# Patient Record
Sex: Male | Born: 1985 | Race: White | Hispanic: No | Marital: Single | Smoking: Former smoker
Health system: Southern US, Community
[De-identification: ages and names within clinical notes are randomized; demographics above are authoritative.]

## PROBLEM LIST (undated history)

## (undated) ENCOUNTER — Emergency Department (HOSPITAL_COMMUNITY): Admission: EM | Payer: Self-pay | Source: Home / Self Care

## (undated) DIAGNOSIS — G35 Multiple sclerosis: Secondary | ICD-10-CM

## (undated) HISTORY — PX: SKIN BIOPSY: SHX1

---

## 2022-03-19 DIAGNOSIS — R454 Irritability and anger: Secondary | ICD-10-CM | POA: Insufficient documentation

## 2022-03-19 DIAGNOSIS — Z5900 Homelessness unspecified: Secondary | ICD-10-CM | POA: Insufficient documentation

## 2022-03-19 DIAGNOSIS — G35 Multiple sclerosis: Secondary | ICD-10-CM | POA: Insufficient documentation

## 2022-03-19 NOTE — Progress Notes (Signed)
TRIAGE: ROUTINE   03/19/22 2121  [facility] Triage Screening (Walk-ins at [facility] only)  How Did You Hear About [place]? Other (Comment) ([place] [employer])  What Is the Reason for Your Visit/Call Today? Pt states he was in a hotel in [facility] that he had not paid for; he states he was there a week before the hotel figured out he was not a paying guest and asked him to leave. [place] [employer] was called and they brought pt to the [facility], dropping him off in the parking lot when they arrived. Pt denies SI, a hx of SI any prior attempts to kill himself, any plans to kill himself, or any prior hospitalizations for mental health concerns. Pt denies HI, AVH, NSSIB, access to guns/weapons, engagement with the legal system, or SA.  How Long Has This Been Causing You Problems? <Week  Have You Recently Had Any Thoughts About Hurting Yourself? No  Are You Planning to Commit Suicide/Harm Yourself At This time? No  Have you Recently Had Thoughts About Hurting Someone Karolee Ohs? No  Are You Planning To Harm Someone At This Time? No  Are you currently experiencing any auditory, visual or other hallucinations? No  Have You Used Any Alcohol or Drugs in the Past 24 Hours? No  Do you have any current medical co-morbidities that require immediate attention? No  Clinician description of patient physical appearance/behavior: Pt is sitting in the triage room with his shirt off. He expresses confusion as to why he was brought to the [facility]. Pt has an odor coming from him/his clothes. He expresses his only needs are food. Pt walks slowly with a walker.  What Do You Feel Would Help You the Most Today? Food Assistance;Housing Assistance  If access to [facility] was not available, would you have sought care in the Emergency Department? No  Determination of Need Routine (7 days)  Options For Referral Other: Comment (Resources for shelters)

## 2022-03-19 NOTE — ED Provider Notes (Signed)
Behavioral Health Urgent Care Medical Screening Exam  Patient Name: Shane Ramirez MRN: [id number] Date of Evaluation: 03/19/22 Chief Complaint:   Diagnosis:  Final diagnoses:  Homelessness    History of Present illness: Zyeir Dymek is a 36 y.o. male. ***  Psychiatric Specialty Exam  Presentation  General Appearance:Disheveled  Eye Contact:Minimal  Speech:Clear and Coherent; Normal Rate  Speech Volume:Normal  Handedness:No data recorded  Mood and Affect  Mood:Irritable  Affect:Restricted   Thought Process  Thought Processes:Coherent  Descriptions of Associations:Intact  Orientation:Partial  Thought Content:No data recorded   Hallucinations:None  Ideas of Reference:None  Suicidal Thoughts:No  Homicidal Thoughts:No   Sensorium  Memory:Immediate Good  Judgment:Intact  Insight:Present   Executive Functions  Concentration:No data recorded Attention Span:No data recorded Recall:No data recorded Fund of Knowledge:No data recorded Language:No data recorded  Psychomotor Activity  Psychomotor Activity:Shuffling Gait   Assets  Assets:Desire for Improvement   Sleep  Sleep:Fair  Number of hours: No data recorded  Nutritional Assessment (For OBS and FBC admissions only) Has the patient had a weight loss or gain of 10 pounds or more in the last 3 months?: No Has the patient had a decrease in food intake/or appetite?: No Does the patient have dental problems?: No Does the patient have eating habits or behaviors that may be indicators of an eating disorder including binging or inducing vomiting?: No Has the patient recently lost weight without trying?: 0 Has the patient been eating poorly because of a decreased appetite?: 0 Malnutrition Screening Tool Score: 0    Physical Exam: Physical Exam ROS Blood pressure 109/61, pulse 98, temperature 98.7 F (37.1 C), temperature source Oral, resp. rate 18, SpO2 98 %. There is no height or weight on file  to calculate BMI.  Musculoskeletal: Strength & Muscle Tone: {desc; muscle tone:32375} Gait & Station: {PE GAIT ED QPYP:95093} Patient leans: {Patient Leans:21022755}   [facility] MSE Discharge Disposition for Follow up and Recommendations: {[facility] MSE Recommendations:24277}   Jackelyn Poling, NP 03/19/2022, 11:59 PM

## 2022-03-19 NOTE — Discharge Instructions (Addendum)
Discharge recommendations:  Patient is to take medications as prescribed. Please see information for follow-up appointment with psychiatry and therapy. Please follow up with your primary care provider for all medical related needs.   Therapy: We recommend that patient participate in individual therapy to address mental health concerns.  Safety:  The patient should abstain from use of illicit substances/drugs and abuse of any medications. If symptoms worsen or do not continue to improve or if the patient becomes actively suicidal or homicidal then it is recommended that the patient return to the closest hospital emergency department, the [facility], or call 911 for further evaluation and treatment. National Suicide Prevention Lifeline 1-800-SUICIDE or (217)102-1065.  About 988 988 offers 24/7 access to trained crisis counselors who can help people experiencing mental health-related distress. People can call or text 988 or chat 988lifeline.org for themselves or if they are worried about a loved one who may need crisis support.     Shelter List:   [facility]) [address] [place], [place] Phone: [phone]   [facility] has been providing emergency shelter to those in need of a permanent residence for over 35 years. The [facility] shelter plays an important role in our community.   There are many life events that can pull someone into a downward spiral towards poverty that is very difficult to get out of. Homelessness is a problem that can affect anyone of [place]. [facility] is a safe and comforting place to stay, especially if you have experienced the hardship of street life.   [facility] provides a single bed and bedding to 100 adult men and women. The shelter welcomes all who are in need of housing, no one in real need is turned away unless space is not available.   While staying  at  [facility], guests are offered more than just a bed for a night. Hot meals are provided and every guest has access to case management services. Case managers provide assistance with finding housing, employment, or other services that will help them gain stability. Continuous stay is based on availability, capacity, and progress towards goals.   To contact the front desk of [facility] please call   [phone] ext [phone] or ext. [phone].   [facility] [address] [place], [place] [address] Phone: [phone]    [facility] [address], [place] [address] Phone: [phone]   [facility]: [phone]. [address] [place], [place] [address] Phone: [phone]   [facility] [address] [place], [place] [address] Check in at 6:00PM for placement at a local shelter) Phone: [phone]     [facility]          -(Please Call) Phone: [phone]   [facility] Eligibility:  Must be drug and alcohol free for at least 14 days or more at the time of application. This program serves males.  Houses Engineer, civil (consulting), Economist who serve six-month terms.  Houses are financially self-supporting; members split house expenses, which average $90.00 to $130.00 per person per week.  Any Resident who relapses must be immediately expelled. Call:  [phone]   [facility]-will provide timely access to mental health services for children and adolescents (4-17) and adults presenting in a mental health crisis. The program is designed for those who need urgent Behavioral Health or  Substance Use treatment and are not experiencing a medical crisis that would typically require an emergency room visit.   [address] [place], [place] [address] Phone: [phone] [email or web address]   [facility] will also  offer the following outpatient services: (Monday through Friday 8am-5pm)   Partial Hospitalization Program (PHP) Substance Abuse Intensive Outpatient Program (SA-IOP) Group Therapy Medication Management Peer Living Room   We also provide (24/7):   Assessments: Our mental health clinician and providers will conduct a focused mental health evaluation, assessing for immediate safety concerns and further mental health needs.   Referral: Our team will provide resources and help connect to community based mental health treatment, when indicated, including psychotherapy, psychiatry, and other specialized behavioral health or substance use disorder services (for those not already in treatment).   Transitional Care: Our team providers in person bridging and/or telphonic follow-up during the patient's transition to outpatient services.

## 2022-03-20 ENCOUNTER — Encounter (HOSPITAL_BASED_OUTPATIENT_CLINIC_OR_DEPARTMENT_OTHER): Payer: Self-pay | Admitting: Obstetrics and Gynecology

## 2022-03-20 ENCOUNTER — Emergency Department (HOSPITAL_BASED_OUTPATIENT_CLINIC_OR_DEPARTMENT_OTHER)
Admission: EM | Admit: 2022-03-20 | Discharge: 2022-03-20 | Disposition: A | Discharge status: Home / Self Care | Payer: Self-pay | Attending: Emergency Medicine | Admitting: Emergency Medicine

## 2022-03-20 ENCOUNTER — Encounter (HOSPITAL_COMMUNITY): Payer: Self-pay

## 2022-03-20 ENCOUNTER — Ambulatory Visit (HOSPITAL_COMMUNITY)
Admission: EM | Admit: 2022-03-20 | Discharge: 2022-03-20 | Disposition: A | Discharge status: Home / Self Care | Payer: No Payment, Other | Attending: Nurse Practitioner | Admitting: Nurse Practitioner

## 2022-03-20 ENCOUNTER — Encounter (HOSPITAL_COMMUNITY): Payer: Self-pay | Admitting: Nurse Practitioner

## 2022-03-20 ENCOUNTER — Other Ambulatory Visit: Payer: Self-pay

## 2022-03-20 ENCOUNTER — Emergency Department (HOSPITAL_COMMUNITY)
Admission: EM | Admit: 2022-03-20 | Discharge: 2022-03-20 | Disposition: A | Discharge status: Home / Self Care | Payer: Self-pay | Attending: Emergency Medicine | Admitting: Emergency Medicine

## 2022-03-20 DIAGNOSIS — Y900 Blood alcohol level of less than 20 mg/100 ml: Secondary | ICD-10-CM | POA: Insufficient documentation

## 2022-03-20 DIAGNOSIS — R3989 Other symptoms and signs involving the genitourinary system: Secondary | ICD-10-CM

## 2022-03-20 DIAGNOSIS — R410 Disorientation, unspecified: Secondary | ICD-10-CM | POA: Insufficient documentation

## 2022-03-20 DIAGNOSIS — Z5900 Homelessness unspecified: Secondary | ICD-10-CM | POA: Insufficient documentation

## 2022-03-20 DIAGNOSIS — R339 Retention of urine, unspecified: Secondary | ICD-10-CM | POA: Insufficient documentation

## 2022-03-20 DIAGNOSIS — G35 Multiple sclerosis: Secondary | ICD-10-CM | POA: Insufficient documentation

## 2022-03-20 HISTORY — DX: Multiple sclerosis: G35

## 2022-03-20 LAB — COMPREHENSIVE METABOLIC PANEL
ALT: 29 U/L (ref 0–44)
AST: 18 U/L (ref 15–41)
Albumin: 3.9 g/dL (ref 3.5–5.0)
Alkaline Phosphatase: 71 U/L (ref 38–126)
Anion gap: 7 (ref 5–15)
BUN: 22 mg/dL — ABNORMAL HIGH (ref 6–20)
CO2: 28 mmol/L (ref 22–32)
Calcium: 9.6 mg/dL (ref 8.9–10.3)
Chloride: 106 mmol/L (ref 98–111)
Creatinine, Ser: 0.85 mg/dL (ref 0.61–1.24)
GFR, Estimated: >60 mL/min (ref >60)
Glucose, Bld: 105 mg/dL — ABNORMAL HIGH (ref 70–99)
Potassium: 3.7 mmol/L (ref 3.5–5.1)
Sodium: 141 mmol/L (ref 135–145)
Total Bilirubin: 0.5 mg/dL (ref 0.3–1.2)
Total Protein: 6.3 g/dL — ABNORMAL LOW (ref 6.5–8.1)

## 2022-03-20 LAB — URINALYSIS, ROUTINE W REFLEX MICROSCOPIC
Bilirubin Urine: NEGATIVE
Glucose, UA: NEGATIVE mg/dL
Hgb urine dipstick: NEGATIVE
Ketones, ur: NEGATIVE mg/dL
Leukocytes,Ua: NEGATIVE
Nitrite: NEGATIVE
Protein, ur: NEGATIVE mg/dL
Specific Gravity, Urine: 1.032 — ABNORMAL HIGH (ref 1.005–1.030)
pH: 5 (ref 5.0–8.0)

## 2022-03-20 LAB — CBC WITH DIFFERENTIAL/PLATELET
Abs Immature Granulocytes: 0.01 10*3/uL (ref 0.00–0.07)
Basophils Absolute: 0.1 10*3/uL (ref 0.0–0.1)
Basophils Relative: 1 %
Eosinophils Absolute: 0.1 10*3/uL (ref 0.0–0.5)
Eosinophils Relative: 2 %
HCT: 39.3 % (ref 39.0–52.0)
Hemoglobin: 12.9 g/dL — ABNORMAL LOW (ref 13.0–17.0)
Immature Granulocytes: 0 %
Lymphocytes Relative: 40 %
Lymphs Abs: 2.1 10*3/uL (ref 0.7–4.0)
MCH: 28.7 pg (ref 26.0–34.0)
MCHC: 32.8 g/dL (ref 30.0–36.0)
MCV: 87.3 fL (ref 80.0–100.0)
Monocytes Absolute: 0.8 10*3/uL (ref 0.1–1.0)
Monocytes Relative: 15 %
Neutro Abs: 2.2 10*3/uL (ref 1.7–7.7)
Neutrophils Relative %: 42 %
Platelets: 182 10*3/uL (ref 150–400)
RBC: 4.5 MIL/uL (ref 4.22–5.81)
RDW: 14.2 % (ref 11.5–15.5)
WBC: 5.3 10*3/uL (ref 4.0–10.5)
nRBC: 0 % (ref 0.0–0.2)

## 2022-03-20 LAB — ETHANOL: Alcohol, Ethyl (B): <10 mg/dL (ref <10)

## 2022-03-20 NOTE — ED Provider Notes (Signed)
[facility] EMERGENCY DEPT Provider Note   CSN: [id number] Arrival date & time: 03/20/22  1252     History {Add pertinent medical, surgical, social history, OB history to HPI:1} Chief Complaint  Patient presents with  . Weakness    Shane Ramirez is a 36 y.o. male.  HPI Patient reports he only has a history of MS and does not take any medications.  Patient is a difficult historian and that for most questions he responds "I do not know".  However, during the course of the interview, the patient told me that he came from [place].  He reports he did have housing in [place] but "it got away from him".  He reports he was dropped off at a bus station and came to [place] hoping to find his brother.  However, he reports his brother is not around here [place] [employer] [place].  Patient reports somebody picked him up and brought him to the emergency department but he does not know why.  When asked him what he hopes to achieve from his visit, he replied "a warm place with some grass."  Patient denies he has any specific symptoms, but as I tried to go to the review of systems he replies, "Yeah sure. That."  Patient denies any drug or alcohol use.    Home Medications Prior to Admission medications   Not on File      Allergies    Patient has no known allergies.    Review of Systems   Review of Systems Level 5 caveat cannot obtain good review of systems due to patient's level of cooperation. Physical Exam Updated Vital Signs BP 113/60 (BP Location: Right Arm)   Pulse 72   Temp 98.1 F (36.7 C)   Resp 16   SpO2 100%  Physical Exam Constitutional:      Comments: Patient is resting quietly.  No acute distress.  Color is good.  No respiratory distress.  moderate obese.  HENT:     Head: Normocephalic and atraumatic.     Nose: Nose normal.     Mouth/Throat:     Mouth: Mucous membranes are moist.     Pharynx: Oropharynx is clear.     Comments:  Airway is clear.  Mucous membranes are moist. Eyes:     Extraocular Movements: Extraocular movements intact.     Conjunctiva/sclera: Conjunctivae normal.     Pupils: Pupils are equal, round, and reactive to light.  Cardiovascular:     Rate and Rhythm: Normal rate and regular rhythm.  Pulmonary:     Effort: Pulmonary effort is normal.     Breath sounds: Normal breath sounds.  Abdominal:     General: There is no distension.     Palpations: Abdomen is soft.     Tenderness: There is no abdominal tenderness. There is no guarding.  Musculoskeletal:     Comments: No peripheral edema.  Musculature of the lower legs is good.  Normal hair coverage and skin condition.  Skin:    General: Skin is warm and dry.  Neurological:     Comments: Patient is alert.  He does not show signs of confusion.  He is oriented to where he is and his past medical history.  He can follow commands but is mildly hostile.  No appreciable focal motor deficits at rest but at baseline patient does use a walker for MS.  Patient does not appear to have significant muscular atrophy to general exam.  ED Results / Procedures / Treatments   Labs (all labs ordered are listed, but only abnormal results are displayed) Labs Reviewed  COMPREHENSIVE METABOLIC PANEL - Abnormal; Notable for the following components:      Result Value   Glucose, Bld 105 (*)    BUN 22 (*)    Total Protein 6.3 (*)    All other components within normal limits  CBC WITH DIFFERENTIAL/PLATELET - Abnormal; Notable for the following components:   Hemoglobin 12.9 (*)    All other components within normal limits  ETHANOL  RAPID URINE DRUG SCREEN, HOSP PERFORMED    EKG None  Radiology No results found.  Procedures Procedures  {Document cardiac monitor, telemetry assessment procedure when appropriate:1}  Medications Ordered in ED Medications - No data to display  ED Course/ Medical Decision Making/ A&P                           Medical  Decision Making Amount and/or Complexity of Data Reviewed Labs: ordered.   Patient brought by EMS.  {Document critical care time when appropriate:1} {Document review of labs and clinical decision tools ie heart score, Chads2Vasc2 etc:1}  {Document your independent review of radiology images, and any outside records:1} {Document your discussion with family members, caretakers, and with consultants:1} {Document social determinants of health affecting pt's care:1} {Document your decision making why or why not admission, treatments were needed:1} Final Clinical Impression(s) / ED Diagnoses Final diagnoses:  Homeless  Multiple sclerosis ([place])    Rx / DC Orders ED Discharge Orders     None

## 2022-03-20 NOTE — Discharge Instructions (Addendum)
1.  A resource guide has been included in your discharge instructions.  This includes information for social services in the [place] area and homeless shelters. 2.  As well as these resources you should try to get established with a family doctor.  There are low-cost clinics in the area.  You should try to establish follow-up as soon as possible.

## 2022-03-20 NOTE — ED Triage Notes (Signed)
Patient reports to the ER BIB EMS:  Patient has a hx of MS. Bystander called EMS. Denies allergies or other medications.  Denies LOC but reports weakness caused him to lay down.   Patient reports he is homeless.  CBG 106

## 2022-03-20 NOTE — ED Notes (Signed)
Pt informed me that he wasn't complaining of anything. Everything that was an issue was chronic from his MS and nothing different/changed.

## 2022-03-20 NOTE — ED Notes (Signed)
Bladder scan 0 ml post void.

## 2022-03-20 NOTE — Progress Notes (Signed)
CSW added resources to AVS for resources.   Maryjean Ka, MSW, LCSWA 03/20/2022 12:24 AM

## 2022-03-20 NOTE — ED Provider Notes (Signed)
[facility] EMERGENCY DEPARTMENT Provider Note   CSN: [id number] Arrival date & time: 03/20/22  2197     History  Chief Complaint  Patient presents with   Urinary Retention    Shane Ramirez is a 36 y.o. male with homelessness who presents to the emergency department stating that he requires running water in order to be able to urinate and as he is homeless he does not have any.  States he has not voided since this morning because of this problem.  Of note patient is ambulatory with walker at baseline reportedly from Multiple sclerosis for which she is not treated.  States he has relocated from [facility].  Was seen at [facility] this morning after removal by police department from hotel after failure to pay for multiple days.  He states he does not have any urinary frequency/urgency/dysuria/difficulty peeing.  States he just needed to listen to running water in order to void.  HPI     Home Medications Prior to Admission medications   Not on File      Allergies    Patient has no known allergies.    Review of Systems   Review of Systems  Genitourinary:        ? Urinary retention   Physical Exam Updated Vital Signs BP 119/86   Pulse 95   Temp 98.3 F (36.8 C) (Oral)   Resp 18   SpO2 97%  Physical Exam Vitals and nursing note reviewed.  Constitutional:      Appearance: He is not ill-appearing or toxic-appearing.  HENT:     Head: Normocephalic and atraumatic.     Mouth/Throat:     Mouth: Mucous membranes are moist.     Pharynx: No oropharyngeal exudate or posterior oropharyngeal erythema.  Eyes:     General:        Right eye: No discharge.        Left eye: No discharge.     Extraocular Movements: Extraocular movements intact.     Conjunctiva/sclera: Conjunctivae normal.     Pupils: Pupils are equal, round, and reactive to light.  Cardiovascular:     Rate and Rhythm: Normal rate and regular rhythm.     Pulses: Normal pulses.     Heart sounds:  Normal heart sounds. No murmur heard. Pulmonary:     Effort: Pulmonary effort is normal. No respiratory distress.     Breath sounds: Normal breath sounds. No wheezing or rales.  Abdominal:     General: Bowel sounds are normal. There is no distension.     Palpations: Abdomen is soft.     Tenderness: There is no abdominal tenderness. There is no right CVA tenderness, guarding or rebound.  Musculoskeletal:        General: No deformity.     Cervical back: Neck supple.     Right lower leg: No edema.  Skin:    General: Skin is warm and dry.     Capillary Refill: Capillary refill takes less than 2 seconds.  Neurological:     General: No focal deficit present.     Mental Status: He is alert and oriented to person, place, and time. Mental status is at baseline.     Comments: Shuffling gait, ambulatory with walker at baseline.   Psychiatric:        Mood and Affect: Mood normal.    ED Results / Procedures / Treatments   Labs (all labs ordered are listed, but only abnormal results are displayed) Labs  Reviewed  URINALYSIS, ROUTINE W REFLEX MICROSCOPIC    EKG None  Radiology No results found.  Procedures Procedures    Medications Ordered in ED Medications - No data to display  ED Course/ Medical Decision Making/ A&P                           Medical Decision Making 36 year old male presents with report for inability to urinate since this morning.  Vital signs are normal and intake.  Cardiopulmonary abdominal exams are benign.  Patient able to provide urine sample without difficulty in triage.  Denies any symptoms at this time.  Again reiterates that he is not having any difficulty urinating, but simply requires audible running water in order to urinate.   Amount and/or Complexity of Data Reviewed Labs: ordered.    Details: UA unremarkable. no infectious findings.   No further work-up warranted near this time.  Clinical concern for emergent underlying etiology that would  warrant further ED work-up or inpatient management exceedingly low.  Patient denies any saddle anesthesia, urinary retention when he is able to listen to running water, urinary or fecal incontinence.  Clinically not concerned for cauda equina syndrome. Post-void residual with bladder scanner < 10 cc. He is declining any further testing this evening.  Continues to endorse that he is at his neurologic baseline with ambulation though he is not treated for his multiple sclerosis.  No further work-up warranted near this time.  Shane Ramirez voiced understanding of her medical evaluation and treatment plan. Each of their questions answered to their expressed satisfaction.  Return precautions were given.  Patient is well-appearing, stable, and was discharged in good condition.  This chart was dictated using voice recognition software, Dragon. Despite the best efforts of this provider to proofread and correct errors, errors may still occur which can change documentation meaning.   Final Clinical Impression(s) / ED Diagnoses Final diagnoses:  None    Rx / DC Orders ED Discharge Orders     None         Paris Lore, PA-C 03/20/22 0443    Tilden Fossa, MD 03/20/22 [phone]

## 2022-03-20 NOTE — ED Notes (Signed)
Pt informed of need for urine. Pt stated that he didn't need to go yet.

## 2022-03-20 NOTE — ED Triage Notes (Signed)
Pt states that he has not voided since this AM

## 2022-03-20 NOTE — ED Notes (Signed)
Discharge paperwork given and understood. Subject was asked where and how he was getting there. Subject was unsure and kept saying he didn't now. Help was offered (cab/bus) but when asked where he wanted to go/where did he come from. He kept saying he didn't know. Subject stated that he would just walk outside and he didn't need any help getting there.

## 2022-03-20 NOTE — Discharge Instructions (Signed)
You are seen in the ER today for your reported inability pee.  He urinated fine in the emergency department.  There is no infection in your urine.  Follow-up in the outpatient setting with the resources provided to you earlier today and with a free clinic listed below and return to the ER with any severe symptoms

## 2022-03-21 ENCOUNTER — Encounter (HOSPITAL_COMMUNITY): Payer: Self-pay

## 2022-03-21 ENCOUNTER — Other Ambulatory Visit: Payer: Self-pay

## 2022-03-21 ENCOUNTER — Emergency Department (HOSPITAL_COMMUNITY)
Admission: EM | Admit: 2022-03-21 | Discharge: 2022-03-21 | Disposition: A | Discharge status: Home / Self Care | Payer: Self-pay | Attending: Emergency Medicine | Admitting: Emergency Medicine

## 2022-03-21 DIAGNOSIS — R52 Pain, unspecified: Secondary | ICD-10-CM | POA: Insufficient documentation

## 2022-03-21 DIAGNOSIS — Z5902 Unsheltered homelessness: Secondary | ICD-10-CM | POA: Insufficient documentation

## 2022-03-21 NOTE — ED Triage Notes (Signed)
Pt reports pain all over and hungry.

## 2022-03-21 NOTE — ED Provider Notes (Signed)
[facility] EMERGENCY DEPARTMENT Provider Note   CSN: [id number] Arrival date & time: 03/21/22  1842     History  No chief complaint on file.   Shane Ramirez is a 36 y.o. male.  HPI  Patient with reported history of MS presents today due to "pain all over".  He arrives via EMS, EMS states patient is requesting food and suffers from unstable housing.  Patient states he has been having lower extremity pain bilaterally for about a year, no worsening pain.  No urinary symptoms, no previous surgeries to the back.  He is ambulatory with assistance of walker.  Originally from [place].  Home Medications Prior to Admission medications   Not on File      Allergies    Patient has no known allergies.    Review of Systems   Review of Systems  Physical Exam Updated Vital Signs BP 122/81 (BP Location: Right Arm)   Pulse 81   Temp 98 F (36.7 C) (Oral)   Resp 16   SpO2 100%  Physical Exam Vitals and nursing note reviewed. Exam conducted with a chaperone present.  Constitutional:      Appearance: Normal appearance.  HENT:     Head: Normocephalic and atraumatic.  Eyes:     General: No scleral icterus.       Right eye: No discharge.        Left eye: No discharge.     Extraocular Movements: Extraocular movements intact.     Pupils: Pupils are equal, round, and reactive to light.  Cardiovascular:     Rate and Rhythm: Normal rate and regular rhythm.     Pulses: Normal pulses.     Heart sounds: Normal heart sounds. No murmur heard.   No friction rub. No gallop.  Pulmonary:     Effort: Pulmonary effort is normal. No respiratory distress.     Breath sounds: Normal breath sounds.  Abdominal:     General: Abdomen is flat. Bowel sounds are normal. There is no distension.     Palpations: Abdomen is soft.     Tenderness: There is no abdominal tenderness.  Skin:    General: Skin is warm and dry.     Coloration: Skin is not jaundiced.  Neurological:     Mental  Status: He is alert. Mental status is at baseline.     Coordination: Coordination normal.     Comments: Cranial nerves II through XII are grossly intact.  Sensation light touch grossly intact, grip strength equal bilaterally and lower extremity strength equal bilaterally.    ED Results / Procedures / Treatments   Labs (all labs ordered are listed, but only abnormal results are displayed) Labs Reviewed - No data to display  EKG None  Radiology No results found.  Procedures Procedures    Medications Ordered in ED Medications - No data to display  ED Course/ Medical Decision Making/ A&P                           Medical Decision Making  Patient presents due to pain all over.  He was seen here yesterday in the ED and given resources, I reviewed laboratory work-up from that time.  CBC was without leukocytosis or anemia although he had a hemoglobin of 12.9.  No AKI, no gross electrolyte derangement.  UA unremarkable, ethanol negative.  I considered imaging but he is neurologically intact and at his baseline, do not think additional imaging  is needed at this time.  BP 122/81 (BP Location: Right Arm)   Pulse 81   Temp 98 F (36.7 C) (Oral)   Resp 16   SpO2 100%   Patient provided resources for homelessness.  Also provided information for neurology follow-up in the area and primary care follow-up in the area.  Patient was given a sandwich, he reports feeling improved.  Patient discharged in stable condition.  Case discussed with my attending who agrees with this plan.        Final Clinical Impression(s) / ED Diagnoses Final diagnoses:  Unsheltered homelessness    Rx / DC Orders ED Discharge Orders     None         Sherrill Raring, Hershal Coria 03/21/22 1906    Noemi Chapel, MD 03/21/22 2206

## 2022-03-21 NOTE — Discharge Instructions (Addendum)
Take Tylenol Motrin for pain.  Please follow-up with neurology, information provided.

## 2022-03-23 ENCOUNTER — Other Ambulatory Visit: Payer: Self-pay

## 2022-03-23 ENCOUNTER — Emergency Department (HOSPITAL_COMMUNITY)
Admission: EM | Admit: 2022-03-23 | Discharge: 2022-03-23 | Disposition: A | Discharge status: Home / Self Care | Payer: Self-pay | Attending: Emergency Medicine | Admitting: Emergency Medicine

## 2022-03-23 ENCOUNTER — Encounter (HOSPITAL_COMMUNITY): Payer: Self-pay

## 2022-03-23 DIAGNOSIS — W19XXXA Unspecified fall, initial encounter: Secondary | ICD-10-CM | POA: Insufficient documentation

## 2022-03-23 DIAGNOSIS — D649 Anemia, unspecified: Secondary | ICD-10-CM | POA: Insufficient documentation

## 2022-03-23 DIAGNOSIS — R5383 Other fatigue: Secondary | ICD-10-CM | POA: Insufficient documentation

## 2022-03-23 DIAGNOSIS — R200 Anesthesia of skin: Secondary | ICD-10-CM | POA: Insufficient documentation

## 2022-03-23 DIAGNOSIS — M791 Myalgia, unspecified site: Secondary | ICD-10-CM | POA: Insufficient documentation

## 2022-03-23 NOTE — ED Provider Notes (Signed)
[facility]-EMERGENCY DEPT Provider Note   CSN: [id number] Arrival date & time: 03/23/22  2017     History  Chief Complaint  Patient presents with   Fall    Fall Pertinent negatives include no chest pain, no abdominal pain, no headaches and no shortness of breath.   Shane Ramirez is a 36 y.o. male with past medical history of multiple sclerosis who is un-homed, who presents for lower extremity numbness.  Patient has presented to the ED twice in the last week requesting food, reports he suffers from unstable housing.  He reports earlier today he had acute onset of numbness below the waist.  He also endorses fatigue. Reports he fell earlier due to legs feeling like they are dead weights. He is not able to characterize his numbness further. He denies HA, fever, vision changes, difficulties with speech or swallowing, weakness of the lower extremities, pain radiating down the leg, urinary or bowel incontinence, shortness of breath, chest pain, nausea/vomiting.  He ambulated around the ED with a walker.  He reports history of MS, currently not on medication, does not follow with neurology.  He reports he has previously presented to the hospital several times for MS-like symptoms though is usually discharged without treatment.  He reports he has not eaten anything all day and requests to be fed.    Home Medications Prior to Admission medications   Not on File      Allergies    Patient has no known allergies.    Review of Systems   Review of Systems  Constitutional:  Positive for fatigue. Negative for fever.  Eyes:  Negative for visual disturbance.  Respiratory:  Negative for shortness of breath.   Cardiovascular:  Negative for chest pain.  Gastrointestinal:  Negative for abdominal pain and vomiting.  Genitourinary:        Negative for urinary or bowel incontinence  Musculoskeletal:  Positive for back pain.  Neurological:  Positive for numbness. Negative for weakness  and headaches.    Physical Exam Updated Vital Signs BP 127/69   Pulse 95   Temp 99.1 F (37.3 C) (Oral)   Resp 16   Ht 6\' 4"  (1.93 m)   Wt 108.9 kg   SpO2 98%   BMI 29.21 kg/m  Physical Exam Constitutional:      Appearance: He is overweight.  HENT:     Head: Normocephalic and atraumatic.     Mouth/Throat:     Mouth: Mucous membranes are moist.  Eyes:     Extraocular Movements: Extraocular movements intact.     Conjunctiva/sclera: Conjunctivae normal.     Pupils: Pupils are equal, round, and reactive to light.  Cardiovascular:     Rate and Rhythm: Normal rate and regular rhythm.     Heart sounds: Normal heart sounds.  Pulmonary:     Effort: Pulmonary effort is normal.     Breath sounds: Normal breath sounds.  Abdominal:     General: Abdomen is flat. There is no distension.     Palpations: Abdomen is soft.     Tenderness: There is no abdominal tenderness.  Musculoskeletal:     Right lower leg: No edema.     Left lower leg: No edema.  Skin:    General: Skin is warm and dry.  Neurological:     Mental Status: He is alert.     Comments: Mental Status: Patient is awake, alert, oriented x3 Speech and language: normal, fluent  Cranial Nerves: II: Pupils equal,  round, and reactive to light.  III,IV, VI: EOMI all cardinal directions, without ptosis or diploplia.  V: Facial sensation is symmetric  VII: Face appears symmetric.  VIII: Hearing is intact to voice X: Uvula and palate elevate symmetrically XI: Shoulder shrug is symmetric. XII: Tongue is midline without atrophy or fasciculations.  Motor: 5/5 BUE and 5/5 BLE Sensory: Sensation intact to light touch in bilateral UEs & LEs Deep Tendon Reflexes: 2-3 beats of clonus bilateral lower extremities Gait: deferred   Psychiatric:        Mood and Affect: Mood normal.        Behavior: Behavior normal.        Thought Content: Thought content normal.        Judgment: Judgment normal.     ED Results / Procedures /  Treatments   Labs (all labs ordered are listed, but only abnormal results are displayed) Labs Reviewed - No data to display  EKG None  Radiology No results found.  Medications Ordered in ED None  ED Course/ Medical Decision Making/ A&P                           Medical Decision Making  Cj Edgell is a 36 y.o. male with past medical history of multiple sclerosis who is un-homed, who presented for lower extremity numbness. He arrived afebrile and HDS. He had recent lab work performed at his ED visit two days ago when he presented for diffuse body pain, lab work with mild normocytic anemia otherwise unremarkable, CMP unremarkable, UA unremarkable, ethanol negative. Today he presents for fall due to his legs feeling numb from the waist down.  He believes this may be secondary to his past history of MS.  Patient has normal neurologic exam, sensation to light touch intact bilateral lower extremities.  When asked to further clarify symptoms of numbness, patient reports his legs feel dead and he is fatigued. Neurologic exam inconsistent with reported numbness. Low concern for acute myelopathy, or acute cord compression given no red flag symptoms. Patient has not eaten all day and I suspect his fatigue and LE symptoms are secondary to poor p.o. intake vs malingering.  Given his current housing and social situation, will consult case manager to assist patient in finding housing placement as well as establishing care with primary care physician.  Patient does have a few beats of clonus in bilateral lower extremities, therefore I do feel he was likely diagnosed with MS versus other CNS pathology and should establish with a neurologist.  Discussed establishing with PCP in order to obtain referral to neurology for MS monitoring and treatment, and evaluation of any new neurologic symptoms. Unfortunately I do anticipate patient will continue to present to the ED for acute complaints in the setting of unstable  housing and food insecurity. He did receive meal in the ED. He is medically stable for discharge.    Final Clinical Impression(s) / ED Diagnoses Final diagnoses:  Lower extremity numbness    Rx / DC Orders ED Discharge Orders     None         Ellison Carwin, MD 03/23/22 2334    Charlynne Pander, MD 03/26/22 1553

## 2022-03-23 NOTE — ED Triage Notes (Signed)
BIB [facility] after falling while walking outside today. States he can't feel his legs, which is normal for him. Hx of MS. No LOC, no thinners, no injury.   Per EMS, pt state he's here because he's tired, hungry, and can't feel his legs.

## 2022-03-23 NOTE — Discharge Instructions (Addendum)
You were evaluated at [facility] ED for numbness below the waist and fatigue. Based off our evaluation you are likely not having an acute multiple sclerosis flare and are safe to discharge.  One of our case managers will reach out to you. She can help you get established with a primary care physician who can help get you in contact with Neurology for further management of your MS.   Thank you for allowing [place] to be part of your care.

## 2022-03-23 NOTE — ED Notes (Signed)
Ambulatory to bathroom with steady gate and walker.

## 2022-03-24 ENCOUNTER — Encounter (HOSPITAL_COMMUNITY): Payer: Self-pay | Admitting: Internal Medicine

## 2022-03-24 ENCOUNTER — Emergency Department (HOSPITAL_COMMUNITY): Payer: Self-pay

## 2022-03-24 ENCOUNTER — Inpatient Hospital Stay (HOSPITAL_COMMUNITY)
Admission: EM | Admit: 2022-03-24 | Discharge: 2022-03-29 | DRG: 060 | Discharge status: Against Medical Advice | Payer: Self-pay | Attending: Internal Medicine | Admitting: Internal Medicine

## 2022-03-24 DIAGNOSIS — Z5900 Homelessness unspecified: Secondary | ICD-10-CM

## 2022-03-24 DIAGNOSIS — G35D Multiple sclerosis, unspecified: Secondary | ICD-10-CM | POA: Diagnosis present

## 2022-03-24 DIAGNOSIS — Z5329 Procedure and treatment not carried out because of patient's decision for other reasons: Secondary | ICD-10-CM | POA: Diagnosis not present

## 2022-03-24 DIAGNOSIS — G35 Multiple sclerosis: Principal | ICD-10-CM | POA: Diagnosis present

## 2022-03-24 DIAGNOSIS — W19XXXA Unspecified fall, initial encounter: Principal | ICD-10-CM

## 2022-03-24 DIAGNOSIS — R0781 Pleurodynia: Secondary | ICD-10-CM

## 2022-03-24 DIAGNOSIS — R296 Repeated falls: Secondary | ICD-10-CM

## 2022-03-24 DIAGNOSIS — K529 Noninfective gastroenteritis and colitis, unspecified: Secondary | ICD-10-CM | POA: Diagnosis present

## 2022-03-24 DIAGNOSIS — R197 Diarrhea, unspecified: Secondary | ICD-10-CM | POA: Diagnosis present

## 2022-03-24 LAB — CBC WITH DIFFERENTIAL/PLATELET
Abs Immature Granulocytes: 0.02 10*3/uL (ref 0.00–0.07)
Basophils Absolute: 0 10*3/uL (ref 0.0–0.1)
Basophils Relative: 1 %
Eosinophils Absolute: 0.1 10*3/uL (ref 0.0–0.5)
Eosinophils Relative: 1 %
HCT: 41.8 % (ref 39.0–52.0)
Hemoglobin: 14 g/dL (ref 13.0–17.0)
Immature Granulocytes: 0 %
Lymphocytes Relative: 22 %
Lymphs Abs: 1.6 10*3/uL (ref 0.7–4.0)
MCH: 29.6 pg (ref 26.0–34.0)
MCHC: 33.5 g/dL (ref 30.0–36.0)
MCV: 88.4 fL (ref 80.0–100.0)
Monocytes Absolute: 1.1 10*3/uL — ABNORMAL HIGH (ref 0.1–1.0)
Monocytes Relative: 15 %
Neutro Abs: 4.4 10*3/uL (ref 1.7–7.7)
Neutrophils Relative %: 61 %
Platelets: 174 10*3/uL (ref 150–400)
RBC: 4.73 MIL/uL (ref 4.22–5.81)
RDW: 13.8 % (ref 11.5–15.5)
WBC: 7.2 10*3/uL (ref 4.0–10.5)
nRBC: 0 % (ref 0.0–0.2)

## 2022-03-24 LAB — COMPREHENSIVE METABOLIC PANEL
ALT: 26 U/L (ref 0–44)
AST: 22 U/L (ref 15–41)
Albumin: 3.8 g/dL (ref 3.5–5.0)
Alkaline Phosphatase: 68 U/L (ref 38–126)
Anion gap: 4 — ABNORMAL LOW (ref 5–15)
BUN: 8 mg/dL (ref 6–20)
CO2: 27 mmol/L (ref 22–32)
Calcium: 9.2 mg/dL (ref 8.9–10.3)
Chloride: 112 mmol/L — ABNORMAL HIGH (ref 98–111)
Creatinine, Ser: 0.76 mg/dL (ref 0.61–1.24)
GFR, Estimated: >60 mL/min (ref >60)
Glucose, Bld: 97 mg/dL (ref 70–99)
Potassium: 3.9 mmol/L (ref 3.5–5.1)
Sodium: 143 mmol/L (ref 135–145)
Total Bilirubin: 1.1 mg/dL (ref 0.3–1.2)
Total Protein: 6.6 g/dL (ref 6.5–8.1)

## 2022-03-24 LAB — RAPID URINE DRUG SCREEN, HOSP PERFORMED
Amphetamines: NOT DETECTED
Barbiturates: NOT DETECTED
Benzodiazepines: NOT DETECTED
Cocaine: NOT DETECTED
Opiates: NOT DETECTED
Tetrahydrocannabinol: NOT DETECTED

## 2022-03-24 LAB — LIPASE, BLOOD: Lipase: 30 U/L (ref 11–51)

## 2022-03-24 IMAGING — CR DG RIBS W/ CHEST 3+V*L*
5 series · 5 of 5 positions shown · non-contrast
Comparison: None Available.

CLINICAL DATA: Fall.  Left chest wall pain.

EXAM:
LEFT RIBS AND CHEST - 3+ VIEW

[x chest ap]
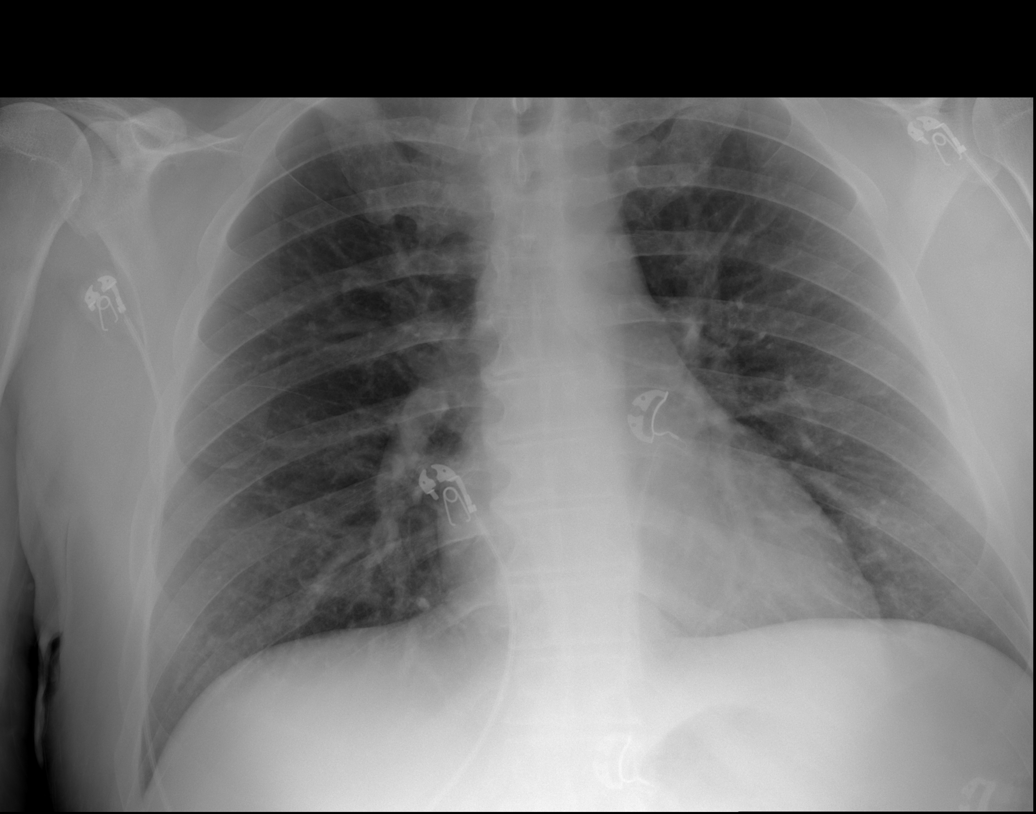

[t ribs ap upper left (1 of 2)]
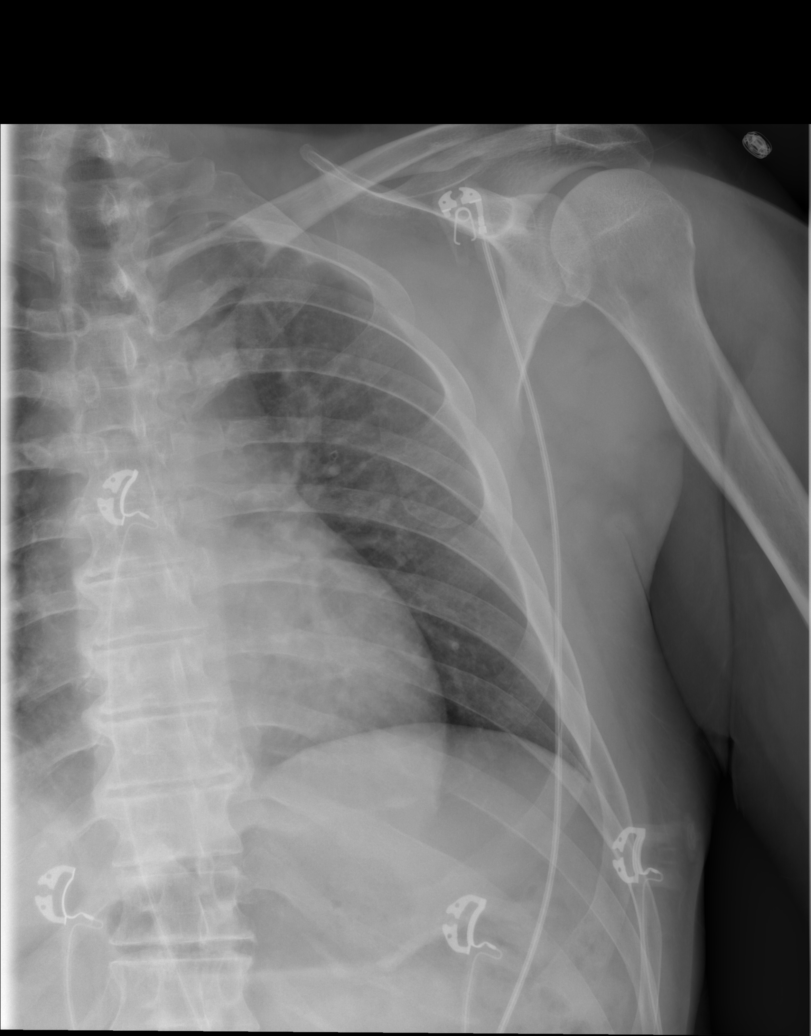

[t ribs ap upper left (2 of 2)]
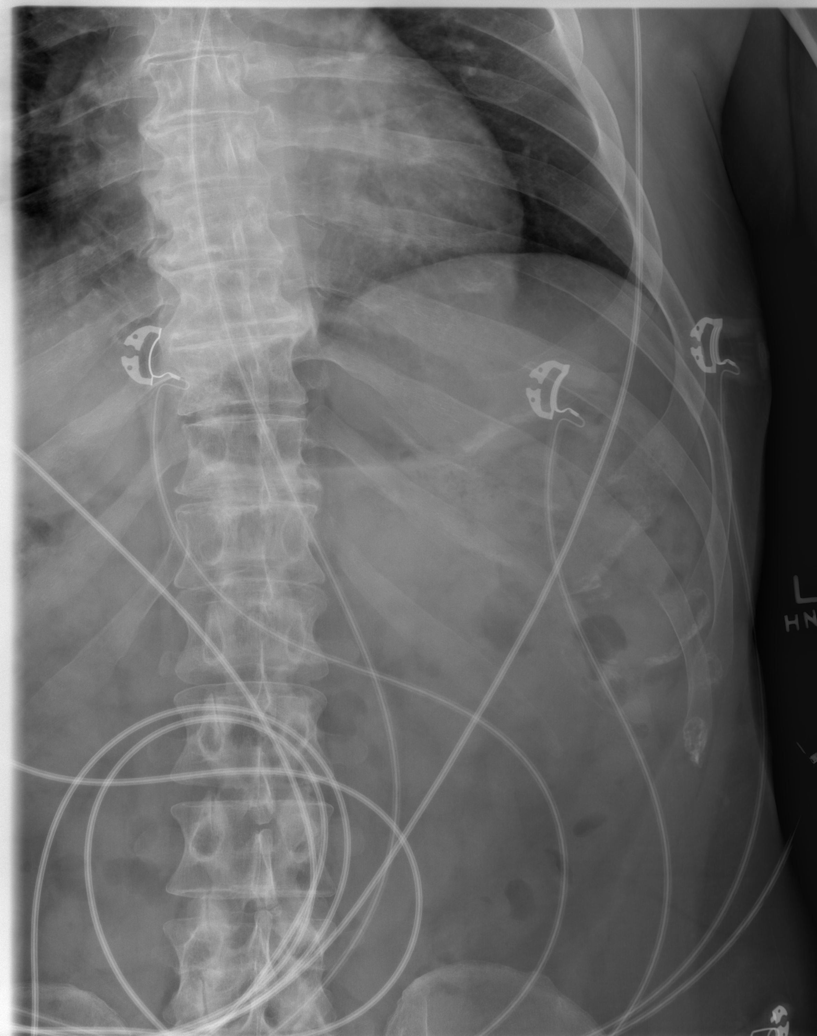

[t ribs lpo left (1 of 2)]
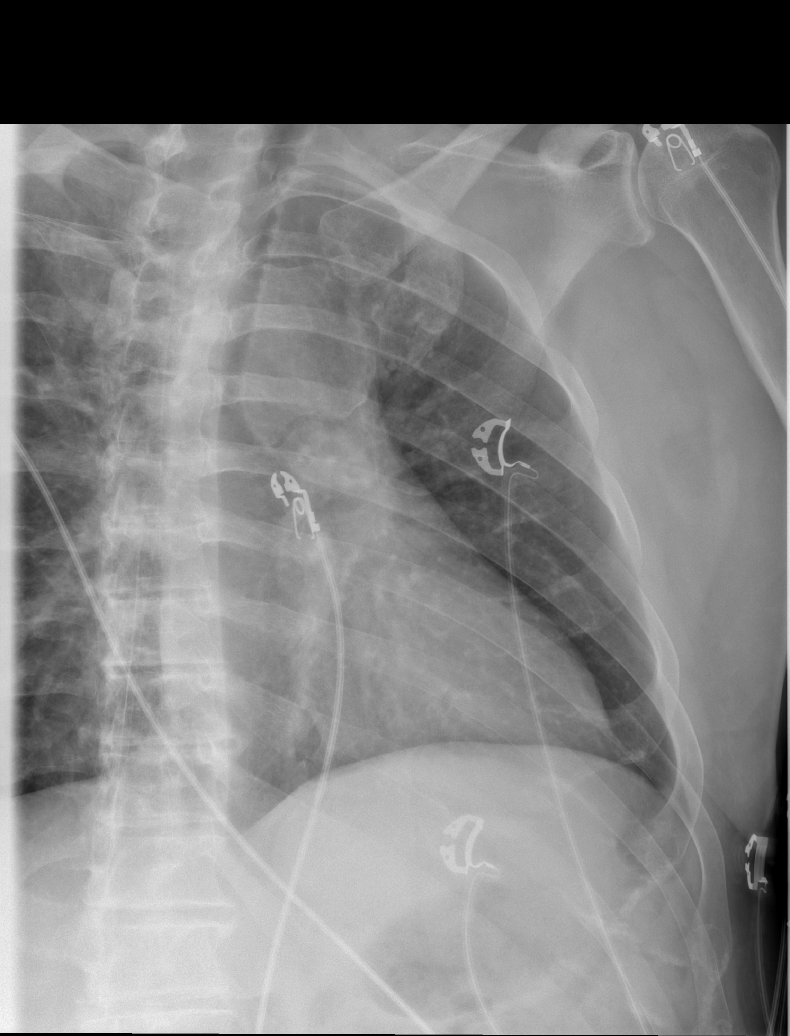

[t ribs lpo left (2 of 2)]
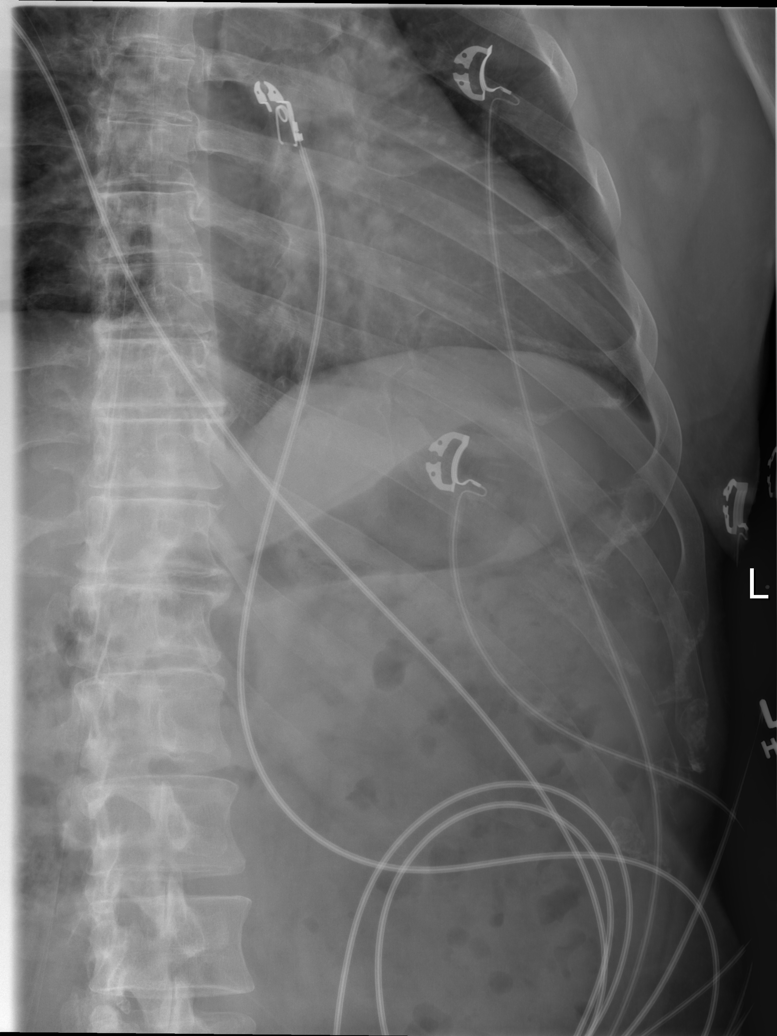

[5 of 5 positions shown; findings below may reference images not displayed]

FINDINGS: The lungs are clear without focal pneumonia, edema, pneumothorax or
pleural effusion. The cardiopericardial silhouette is within normal
limits for size. Oblique views of the left ribs show no evidence for
an acute displaced left-sided rib fracture
IMPRESSION: Negative.

## 2022-03-24 MED ORDER — ENOXAPARIN SODIUM 40 MG/0.4ML IJ SOSY
40.0000 mg | PREFILLED_SYRINGE | INTRAMUSCULAR | Status: DC
  Administered 2022-03-24 – 2022-03-27 (×4): 40 mg via SUBCUTANEOUS
  Filled 2022-03-24 (×5): qty 0.4

## 2022-03-24 NOTE — Progress Notes (Signed)
.  Transition of Care [facility]) - Emergency Department Mini Assessment   Patient Details  Name: Shane Ramirez MRN: [id number] Date of Birth: November 03, 1985  Transition of Care [facility]) CM/SW Contact:    Larrie Kass, LCSW Phone Number: 03/24/2022, 1:04 PM   Clinical Narrative:  Pt has had 5 ED visits in the past week, pt is currently homeless, and no insurance. Pt is requesting short term rehab placement. Due to pt having no payor source pt is unable to be placed at this time. Pt will need to apply for SS Disability to qualify for Medicaid. Pt was approved to receive a LOG for a wheel chair, CSW contact [employer] and spoke with jasmine who stated she will work on getting pt's profile together.    2:17pm  Pt is being admitted TOC to follow on the floor   ED Mini Assessment: What brought you to the Emergency Department? : falls  Barriers to Discharge: Continued Medical Work up, ED Patient/Family Requesting Placement but Has No Payor  Barrier interventions: Homeless, no Payor soucre     Interventions which prevented an admission or readmission: DME Provided    Patient Contact and Communications        ,                 Admission diagnosis:  fall, rib pain There are no problems to display for this patient.  PCP:  Pcp, No Pharmacy:   [facility] [address] [place] [address] Phone: [phone] Fax: [phone]

## 2022-03-24 NOTE — H&P (Signed)
History and Physical    Shane Ramirez [id number] DOB: August 22, 1986 DOA: 03/24/2022  PCP: Pcp, No Patient coming from: Homeless  I have personally briefly reviewed patient's old medical records in [facility] Link  Chief Complaint: Falls  HPI: Shane Ramirez is a 36 y.o. male with medical history significant of MS diagnosed in 2022, not on any medication, reported has been following for several month, is from [place], came to [place] a month ago now homeless, bystanders called ambulance due to seeing him falling on the street, he is brought to the ED, this is the fifth ED visit in the past week due to falls  Report quit drinking several years ago, does not smoke, does not use drugs.  He denies pain, no fever.  ED Course:  Date reviewed: Blood pressure (!) 159/75, pulse 89, temperature 98.8 F (37.1 C), temperature source Oral, resp. rate 17, height 6\' 4"  (1.93 m), weight 102.1 kg, SpO2 98 %. Basic lab works unremarkable    Review of Systems: As per HPI otherwise all other systems reviewed and are negative.   Past Medical History:  Diagnosis Date   Multiple sclerosis (HCC)     History reviewed. No pertinent surgical history.  Social History  reports that he has never smoked. He has never been exposed to tobacco smoke. He has never used smokeless tobacco. He reports that he does not currently use alcohol. He reports that he does not currently use drugs.  No Known Allergies  History reviewed. No pertinent family history.  Prior to Admission medications   Not on File    Physical Exam: Vitals:   03/24/22 1315 03/24/22 1330 03/24/22 1518 03/24/22 1534  BP:   (!) 159/75   Pulse: 93 84 89   Resp: 16 19 17    Temp:   98.8 F (37.1 C)   TempSrc:   Oral   SpO2: 96% 100% 98%   Weight:    102.1 kg  Height:    6\' 4"  (1.93 m)    Constitutional: Flat affect, poor historian, states he does not keep track with the month, think this is July Eyes: PERRL, lids and  conjunctivae normal ENMT: Mucous membranes are moist.  Respiratory: clear to auscultation bilaterally, no wheezing, no crackles. Normal respiratory effort. No accessory muscle use.  Cardiovascular: Regular rate and rhythm,  No extremity edema. 2+ pedal pulses. No carotid bruits.  Abdomen: no tenderness, not distended, Bowel sounds positive.  Musculoskeletal: No edema Skin: no rashes, lesions, ulcers. No induration Neurologic: Sensation appear intact, left lower extremity appear weak 4/5 Psychiatric: flat affect, no agitation    Labs on Admission: I have personally reviewed following labs and imaging studies  CBC: Recent Labs  Lab 03/20/22 1627 03/24/22 1032  WBC 5.3 7.2  NEUTROABS 2.2 4.4  HGB 12.9* 14.0  HCT 39.3 41.8  MCV 87.3 88.4  PLT 182 174    Basic Metabolic Panel: Recent Labs  Lab 03/20/22 1627 03/24/22 1032  NA 141 143  K 3.7 3.9  CL 106 112*  CO2 28 27  GLUCOSE 105* 97  BUN 22* 8  CREATININE 0.85 0.76  CALCIUM 9.6 9.2    GFR: Estimated Creatinine Clearance: 161.2 mL/min (by C-G formula based on SCr of 0.76 mg/dL).  Liver Function Tests: Recent Labs  Lab 03/20/22 1627 03/24/22 1032  AST 18 22  ALT 29 26  ALKPHOS 71 68  BILITOT 0.5 1.1  PROT 6.3* 6.6  ALBUMIN 3.9 3.8    Urine analysis:  Component Value Date/Time   COLORURINE YELLOW 03/20/2022 0401   APPEARANCEUR HAZY (A) 03/20/2022 0401   LABSPEC 1.032 (H) 03/20/2022 0401   PHURINE 5.0 03/20/2022 0401   GLUCOSEU NEGATIVE 03/20/2022 0401   HGBUR NEGATIVE 03/20/2022 0401   BILIRUBINUR NEGATIVE 03/20/2022 0401   KETONESUR NEGATIVE 03/20/2022 0401   PROTEINUR NEGATIVE 03/20/2022 0401   NITRITE NEGATIVE 03/20/2022 0401   LEUKOCYTESUR NEGATIVE 03/20/2022 0401    Radiological Exams on Admission: DG Ribs Unilateral W/Chest Left  Result Date: 03/24/2022 CLINICAL DATA:  Fall.  Left chest wall pain. EXAM: LEFT RIBS AND CHEST - 3+ VIEW COMPARISON:  None Available. FINDINGS: The lungs are clear  without focal pneumonia, edema, pneumothorax or pleural effusion. The cardiopericardial silhouette is within normal limits for size. Oblique views of the left ribs show no evidence for an acute displaced left-sided rib fracture IMPRESSION: Negative. Electronically Signed   By: Kennith Center M.D.   On: 03/24/2022 10:46     Assessment/Plan Principal Problem:   Falls Active Problems:   Multiple sclerosis (HCC)   Homeless    Falls 5th ED visit in a week due to falls With h/o MS,  Report was diagnosed with MS in [place] in 2022 , obtain record  Check B12 level, CK level Pt/OT case discussed with neurology who recommend MRI brain c spine/t spine w wo contrast Neurology will see patient in consult   Homeless Social worker consult  DVT prophylaxis:   Lovenox   Code Status:  Full  Family Communication:  State both parents passed away from MS, has 2 brothers he has no contact with  Patient is from: Homeless   Anticipated DC to: TBD   Anticipated DC date: TBD    Consults called:  Neurology Dr. Amada Jupiter Admission status:  OBS for now, may need to change to inpatient if confirmed MS flare and need to be treated     Voice Recognition /Dragon dictation system was used to create this note, attempts have been made to correct errors. Please contact the author with questions and/or clarifications.  Albertine Grates MD PhD FACP [facility]  How to contact the [facility] Attending or Consulting provider 7A - 7P or covering provider during after hours 7P -7A, for this patient?   Check the care team in [facility] and look for a) attending/consulting TR[facility] provider listed and b) the [facility] team listed Log into [email or web address] and use [facility] universal password to access. If you do not have the password, please contact the hospital operator. Locate the [facility] provider you are looking for under [facility] and page to a number that you can be directly reached. If you still have difficulty reaching the  provider, please page the [facility] (Director on Call) for the Hospitalists listed on amion for assistance.  03/24/2022, 5:13 PM

## 2022-03-24 NOTE — ED Provider Notes (Signed)
[facility] DEPT Provider Note   CSN: [id number] Arrival date & time: 03/24/22  0944     History  No chief complaint on file.   Shane Ramirez is a 36 y.o. male.  The history is provided by the patient and medical records. No language interpreter was used.  Fall This is a recurrent problem. The current episode started 3 to 5 hours ago. The problem occurs daily. The problem has not changed since onset.Associated symptoms include chest pain. Pertinent negatives include no abdominal pain, no headaches and no shortness of breath. Nothing aggravates the symptoms. Nothing relieves the symptoms. He has tried nothing for the symptoms. The treatment provided no relief.       Home Medications Prior to Admission medications   Not on File      Allergies    Patient has no known allergies.    Review of Systems   Review of Systems  Constitutional:  Negative for chills, fatigue and fever.  HENT:  Negative for congestion.   Eyes:  Negative for visual disturbance.  Respiratory:  Negative for cough, chest tightness, shortness of breath and wheezing.   Cardiovascular:  Positive for chest pain. Negative for palpitations.  Gastrointestinal:  Negative for abdominal pain, constipation, diarrhea, nausea and vomiting.  Genitourinary:  Negative for dysuria and flank pain.  Musculoskeletal:  Positive for gait problem. Negative for back pain, neck pain and neck stiffness.  Skin:  Negative for rash and wound.  Neurological:  Positive for numbness (at baseline). Negative for dizziness, weakness, light-headedness and headaches.  Psychiatric/Behavioral:  Negative for agitation and confusion.     Physical Exam Updated Vital Signs BP 134/76   Pulse 84   Temp 97.6 F (36.4 C) (Oral)   Resp 19   SpO2 100%  Physical Exam Vitals and nursing note reviewed.  Constitutional:      General: He is not in acute distress.    Appearance: He is well-developed.  HENT:     Head:  Normocephalic and atraumatic.     Nose: No congestion or rhinorrhea.     Mouth/Throat:     Mouth: Mucous membranes are moist.  Eyes:     Extraocular Movements: Extraocular movements intact.     Conjunctiva/sclera: Conjunctivae normal.     Pupils: Pupils are equal, round, and reactive to light.  Cardiovascular:     Rate and Rhythm: Normal rate and regular rhythm.     Heart sounds: No murmur heard. Pulmonary:     Effort: Pulmonary effort is normal. No respiratory distress.     Breath sounds: Normal breath sounds. No wheezing, rhonchi or rales.  Chest:     Chest wall: Tenderness present.  Abdominal:     General: Abdomen is flat.     Palpations: Abdomen is soft.     Tenderness: There is no abdominal tenderness. There is no right CVA tenderness, left CVA tenderness, guarding or rebound.  Musculoskeletal:        General: Tenderness (chest wall) present. No swelling.     Cervical back: Neck supple.  Skin:    General: Skin is warm and dry.     Capillary Refill: Capillary refill takes less than 2 seconds.     Findings: No erythema or rash.  Neurological:     Mental Status: He is alert. Mental status is at baseline.     Sensory: Sensory deficit (numbness in both legs reportedly at baseline) present.     Motor: No weakness.  Psychiatric:  Mood and Affect: Mood normal.     ED Results / Procedures / Treatments   Labs (all labs ordered are listed, but only abnormal results are displayed) Labs Reviewed  CBC WITH DIFFERENTIAL/PLATELET - Abnormal; Notable for the following components:      Result Value   Monocytes Absolute 1.1 (*)    All other components within normal limits  COMPREHENSIVE METABOLIC PANEL - Abnormal; Notable for the following components:   Chloride 112 (*)    Anion gap 4 (*)    All other components within normal limits  LIPASE, BLOOD    EKG None  Radiology DG Ribs Unilateral W/Chest Left  Result Date: 03/24/2022 CLINICAL DATA:  Fall.  Left chest wall  pain. EXAM: LEFT RIBS AND CHEST - 3+ VIEW COMPARISON:  None Available. FINDINGS: The lungs are clear without focal pneumonia, edema, pneumothorax or pleural effusion. The cardiopericardial silhouette is within normal limits for size. Oblique views of the left ribs show no evidence for an acute displaced left-sided rib fracture IMPRESSION: Negative. Electronically Signed   By: Misty Stanley M.D.   On: 03/24/2022 10:46    Procedures Procedures    Medications Ordered in ED Medications - No data to display  ED Course/ Medical Decision Making/ A&P                           Medical Decision Making Amount and/or Complexity of Data Reviewed Labs: ordered. Radiology: ordered.  Risk Decision regarding hospitalization.    Shane Ramirez is a 36 y.o. male with a past medical history significant for MS and has not been on medications for around 1 year since moving from [place] who presents with left-sided chest wall pain after a recurrent fall.  According to patient, he has had about 7 falls this week and has presented to the emergency department multiple times.  This is his sixth visit in the last 4 days primarily due to falls and persistent leg symptoms.  He reports that he has had bilateral leg numbness for around a year or so since being off his medications leading to multiple falls however he is denying any new weakness in extremities.  He says that he does not have any new headache, neck pain, or back pain, just has the persistent leg symptoms leading to falls.    The fall this morning he fell landing on his walker and has left anterior inferior and lateral chest discomfort.  He reports some pain with deep breathing but denies significant shortness of breath.  Denies nausea, vomiting, constipation, diarrhea, or urinary changes.  Denies any urinary retention or incontinence.  Denies any headache, neck pain, or back pain.  Denies other complaints.  On exam, he does have some tenderness in his left  chest but I do not appreciate any crepitance.  Breath sounds were symmetric bilaterally.  He did not have any abdominal tenderness and he had normal bowel sounds.  Intact strength in extremities.  He reported subjective numbness in both legs which is unchanged from his baseline.  Denied numbness in arms.  No numbness in face  For his left lateral chest discomfort after a fall, we will get x-ray with left rib focus and some basic labs.  If his traumatic imaging and work-up is reassuring which I anticipate will be, I suspect we will need to discuss both with case manager/social work and neurology to confirm an outpatient plan for him.  With his numerous falls I am  concerned about him being discharged to have more falls.  10:14 AM Just spoke to Dr. Quinn Axe with neurology who agreed that given the patient's lack of any new neurologic deficits, she does not feel he needs MRIs, steroids, or further neurologic work-up at this time.  She does feel that placing an amatory referral to [facility] neurology would be prudent so that he can get an appointment scheduled to get established and managed by a neurology team as an outpatient.  If he does need placement of some kind, they would be able to take him to that appointment when it is made.  We will order the [facility] referral and await discussion with case management/social work after work-up for chest pain has been completed  12:25 PM Chest x-ray did not show rib fracture or other acute abnormality.  Labs reassuring.  Clinically I am concerned that the patient's bilateral leg numbness from his MS is causing numerous falls.  We will consult case management/social work to discuss disposition however anticipate he may need physical therapy evaluation to determine level of care needs at this time.  On reassessment patient is still complaining of persistent numbness in his legs.  With 7 falls in several days and 6 visits to the emergency department, I am concerned about him  being discharged to continue following.  Hospitalist will admit for further mobility assessment by physical therapy.  They also may end up getting imaging of his back to more definitively rule out acute flare although his description of symptoms do not suggest that this time.  Medicine will admit.        Final Clinical Impression(s) / ED Diagnoses Final diagnoses:  Fall, initial encounter  Rib pain on left side  Multiple falls     Clinical Impression: 1. Fall, initial encounter   2. Rib pain on left side   3. Multiple falls     Disposition: Admit  This note was prepared with assistance of Dragon voice recognition software. Occasional wrong-word or sound-a-like substitutions may have occurred due to the inherent limitations of voice recognition software.      Joeann Steppe, Gwenyth Allegra, MD 03/24/22 [phone]

## 2022-03-24 NOTE — ED Triage Notes (Signed)
[place] was walking w/ his walker and it caught on the ground causing pt to fall onto walker injuring his L ribcage.  Hx of MS with chronic numbness in legs.  Aox4  BP: 138/60 HR: 84 SPO2: 97 CBG: 127

## 2022-03-24 NOTE — Consult Note (Signed)
Neurology Consultation Reason for Consult: Falls Referring Physician: Dillard Cannon  CC: Falls  History is obtained from:Patient  HPI: Shane Ramirez is a 36 y.o. male who was diagnosed with multiple sclerosis in 2022 after developing lower extremity symptoms.  He was treated with IV steroids at that time and was discharged.  He has been lost to follow-up since that time and is never started disease modifying therapy.  He moved initially to [place], but then moved to [place] to try and find his brother, but found out that he was no longer here.  He does not want to stay locally, and plans to move back once able.  He has been seen in the emergency department five times over the past few weeks due to falls.  With his current presentation[place] and therefore 911 was called and he was brought into the ER where he was admitted.  He feels his symptoms have not changed at all, no new weakness or numbness since his initial presentation in 2022.    ROS: A 14 point ROS was performed and is negative except as noted in the HPI.  Past Medical History:  Diagnosis Date   Multiple sclerosis ([place])      History reviewed. No pertinent family history.   Social History:  reports that he has never smoked. He has never been exposed to tobacco smoke. He has never used smokeless tobacco. He reports that he does not currently use alcohol. He reports that he does not currently use drugs.   Exam: Current vital signs: BP (!) 144/76 (BP Location: Left Arm)   Pulse 84   Temp 99.1 F (37.3 C) (Oral)   Resp 17   Ht 6\' 4"  (1.93 m)   Wt 102.1 kg   SpO2 98%   BMI 27.39 kg/m  Vital signs in last 24 hours: Temp:  [97.6 F (36.4 C)-99.1 F (37.3 C)] 99.1 F (37.3 C) (06/10 1846) Pulse Rate:  [78-95] 84 (06/10 1846) Resp:  [12-19] 17 (06/10 1846) BP: (122-159)/(63-84) 144/76 (06/10 1846) SpO2:  [96 %-100 %] 98 % (06/10 1846) Weight:  [102.1 kg-108.9 kg] 102.1 kg (06/10  1534)   Physical Exam  Constitutional: Appears well-developed and well-nourished.  Psych: Affect appropriate to situation Eyes: No scleral injection HENT: No OP obstruction MSK: no joint deformities.  Cardiovascular: Normal rate and regular rhythm.  Respiratory: Effort normal, non-labored breathing GI: Soft.  No distension. There is no tenderness.  Skin: WDI  Neuro: Mental Status: Patient is awake, alert, oriented to person, place, month, year, and situation. Patient is able to give a clear and coherent history. No signs of aphasia or neglect Cranial Nerves: II: Visual Fields are full. Pupils are equal, round, and reactive to light.   III,IV, VI: EOMI without ptosis or diploplia.  V: Facial sensation is symmetric to temperature VII: Facial movement is symmetric.  VIII: hearing is intact to voice X: Uvula elevates symmetrically XI: Shoulder shrug is symmetric. XII: tongue is midline without atrophy or fasciculations.  Motor: Tone is normal. Bulk is normal. 5/5 strength was present in all four extremities.  Sensory: Sensation is decreased symmetrically in bilateral lower extremities to light touch Deep Tendon Reflexes: 2+ and symmetric in the biceps and 3+ in the patellae.  Plantars: Toes are downgoing bilaterally.  Cerebellar: FNF intact bilateraly      I have reviewed labs in epic and the results pertinent to this consultation are: CMP is unremarkable CBC is unremarkable  Impression: 36 year old male with a history of multiple sclerosis who presents with increasing frequency of falls.  It is unclear to me if there may be some degree of secondary gain given that he denies any type of new symptoms, so unclear why he is falling more.  I do think it is reasonable to reassess with imaging to see if there is acute flare that he may not be really noticing symptomatically, but is causing increasing falls in which might benefit from IV steroids.  Recommendations: 1) MRI  brain, C-spine, T-spine with and without contrast 2) if no enhancing lesions, no further recommendations other than PT/OT 3) if there is enhancing lesions would start Solu-Medrol 1 g daily x3 to 5 days   Roland Rack, MD [facility] [phone]  If 7pm- 7am, please page neurology on call as listed in [place].

## 2022-03-25 ENCOUNTER — Observation Stay (HOSPITAL_COMMUNITY): Payer: Self-pay

## 2022-03-25 LAB — BASIC METABOLIC PANEL
Anion gap: 7 (ref 5–15)
BUN: 17 mg/dL (ref 6–20)
CO2: 25 mmol/L (ref 22–32)
Calcium: 8.6 mg/dL — ABNORMAL LOW (ref 8.9–10.3)
Chloride: 110 mmol/L (ref 98–111)
Creatinine, Ser: 0.79 mg/dL (ref 0.61–1.24)
GFR, Estimated: >60 mL/min (ref >60)
Glucose, Bld: 91 mg/dL (ref 70–99)
Potassium: 3.9 mmol/L (ref 3.5–5.1)
Sodium: 142 mmol/L (ref 135–145)

## 2022-03-25 LAB — VITAMIN B12: Vitamin B-12: 303 pg/mL (ref 180–914)

## 2022-03-25 LAB — CBC
HCT: 38.1 % — ABNORMAL LOW (ref 39.0–52.0)
Hemoglobin: 12.7 g/dL — ABNORMAL LOW (ref 13.0–17.0)
MCH: 29.7 pg (ref 26.0–34.0)
MCHC: 33.3 g/dL (ref 30.0–36.0)
MCV: 89 fL (ref 80.0–100.0)
Platelets: 161 10*3/uL (ref 150–400)
RBC: 4.28 MIL/uL (ref 4.22–5.81)
RDW: 14 % (ref 11.5–15.5)
WBC: 5.5 10*3/uL (ref 4.0–10.5)
nRBC: 0 % (ref 0.0–0.2)

## 2022-03-25 LAB — CK: Total CK: 33 U/L — ABNORMAL LOW (ref 49–397)

## 2022-03-25 LAB — MAGNESIUM: Magnesium: 2 mg/dL (ref 1.7–2.4)

## 2022-03-25 LAB — HIV ANTIBODY (ROUTINE TESTING W REFLEX): HIV Screen 4th Generation wRfx: NONREACTIVE

## 2022-03-25 LAB — TSH: TSH: 1.704 u[IU]/mL (ref 0.350–4.500)

## 2022-03-25 IMAGING — MR MR CERVICAL SPINE W/O CM
2 series · 30 of 30 positions shown · non-contrast
Comparison: None Available.

CLINICAL DATA: Multiple sclerosis

EXAM:
MRI HEAD WITHOUT CONTRAST
MRI CERVICAL SPINE WITHOUT CONTRAST
TECHNIQUE: Multiplanar, multiecho pulse sequences of the brain and surrounding
structures, and cervical spine, to include the craniocervical
junction and cervicothoracic junction, were obtained without
intravenous contrast.

[Series 5: T1 · sagittal · 3.0mm · 0.69mm/px · 15 of 15 slices shown]
[im 1/15]
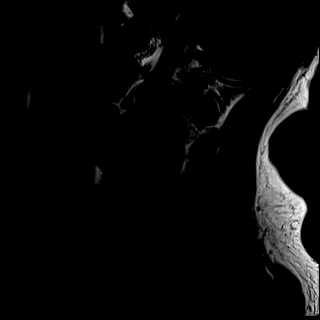
[im 2/15]
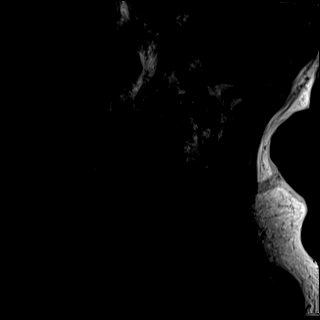
[im 3/15]
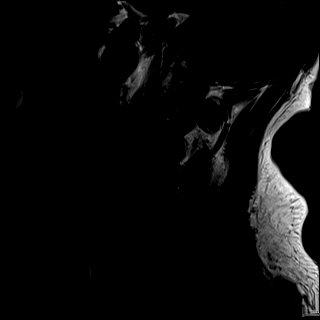
[im 4/15]
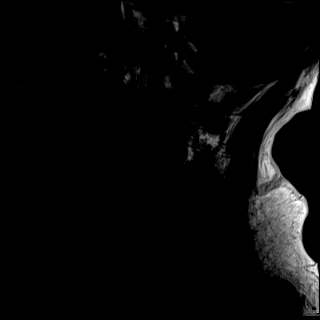
[im 5/15]
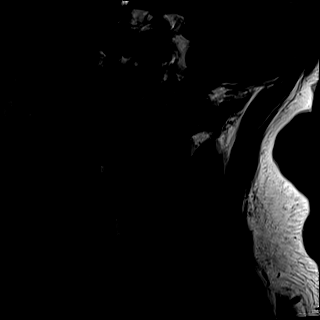
[im 6/15]
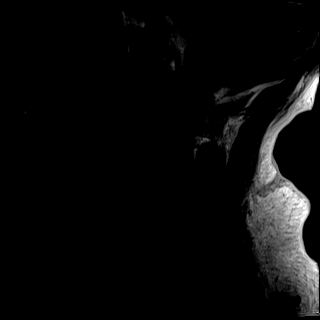
[im 7/15]
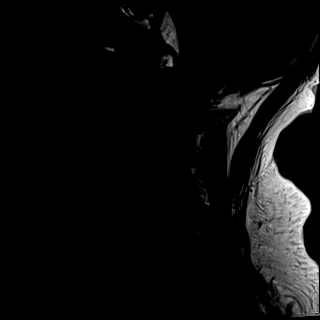
[im 8/15]
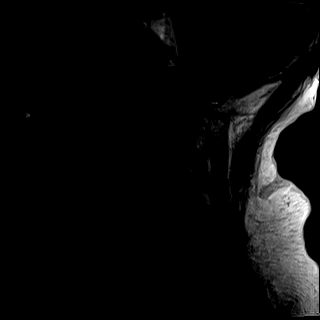
[im 9/15]
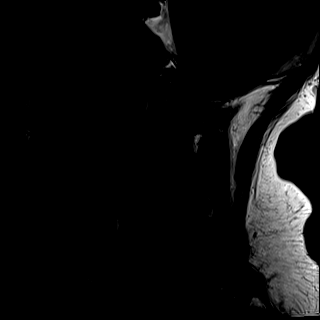
[im 10/15]
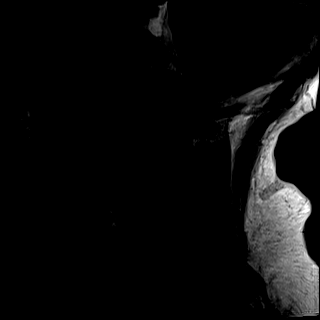
[im 11/15]
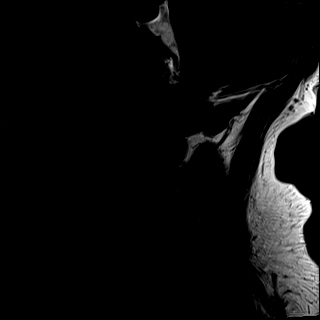
[im 12/15]
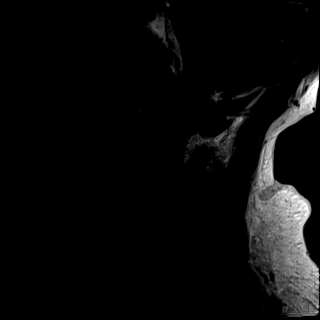
[im 13/15]
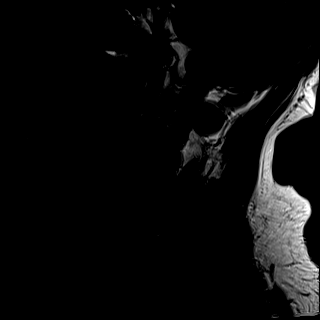
[im 14/15]
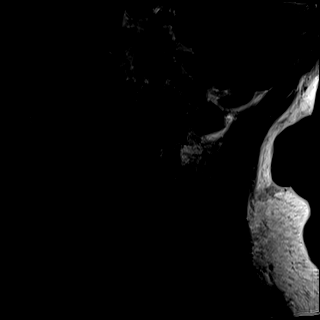
[im 15/15]
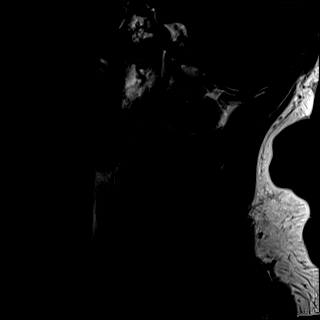

[Series 6: STIR · sagittal · 3.0mm · 0.86mm/px · 15 of 15 slices shown]
[im 1/15]
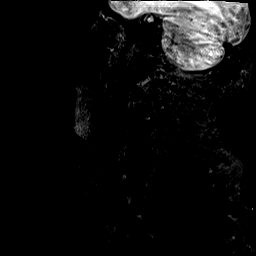
[im 2/15]
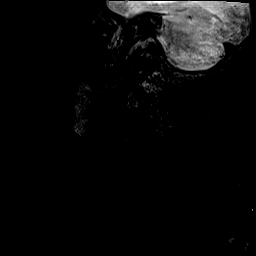
[im 3/15]
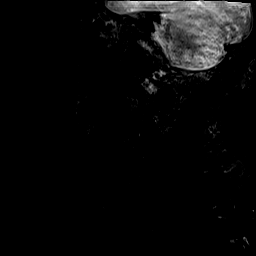
[im 4/15]
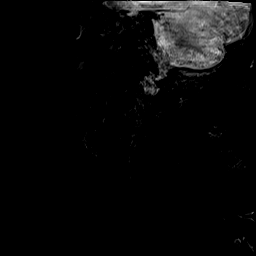
[im 5/15]
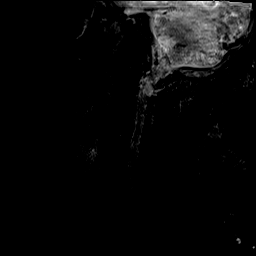
[im 6/15]
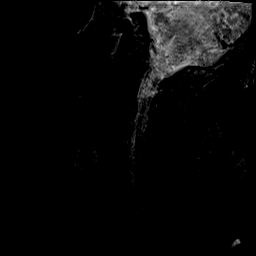
[im 7/15]
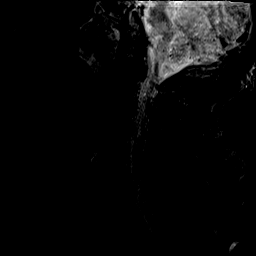
[im 8/15]
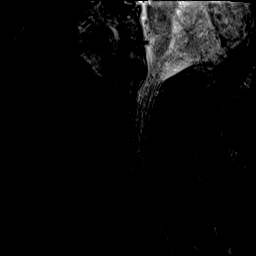
[im 9/15]
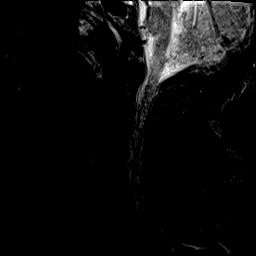
[im 10/15]
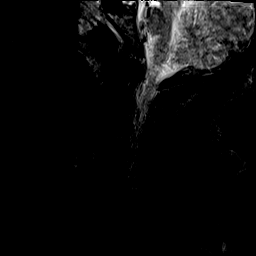
[im 11/15]
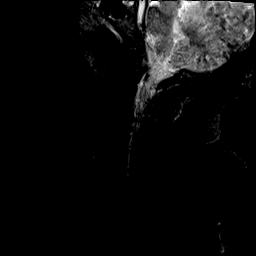
[im 12/15]
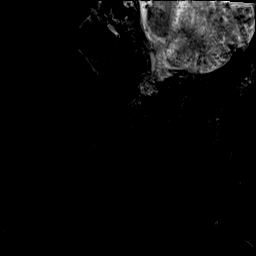
[im 13/15]
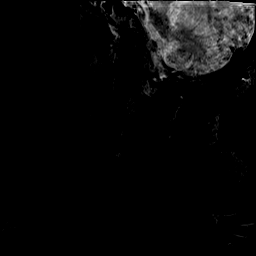
[im 14/15]
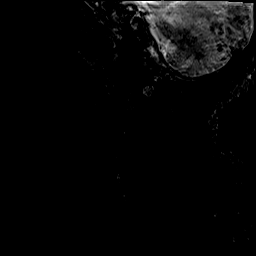
[im 15/15]
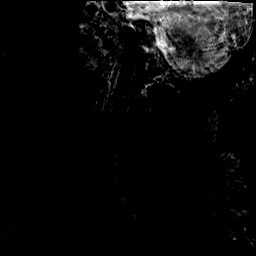

[30 of 30 positions shown; findings below may reference images not displayed]

FINDINGS: Examination is severely degraded by motion. The was terminated prior
to completion due to patient inability to cooperate with the
technologist's instructions.

MRI HEAD FINDINGS

There is a large hyperintense T2-weighted signal lesion within the
left parietal periatrial white matter. Otherwise, imaging quality is
too degraded to give much information.

MRI CERVICAL SPINE FINDINGS

There is no spinal canal stenosis. Cord parenchyma cannot be
assessed due to motion.
IMPRESSION: 1. Severely motion degraded and truncated examination.
2. Large hyperintense T2-weighted signal lesion within the left
parietal periatrial white matter. This may indicate a demyelinating
lesion or other inflammatory process.
3. No spinal canal stenosis. Cord parenchyma cannot be assessed due
to motion.

## 2022-03-25 IMAGING — MR MR HEAD W/O CM
4 series · 48 of 48 positions shown · non-contrast
Comparison: None Available.

CLINICAL DATA: Multiple sclerosis

EXAM:
MRI HEAD WITHOUT CONTRAST
MRI CERVICAL SPINE WITHOUT CONTRAST
TECHNIQUE: Multiplanar, multiecho pulse sequences of the brain and surrounding
structures, and cervical spine, to include the craniocervical
junction and cervicothoracic junction, were obtained without
intravenous contrast.

[Series 9: DWI · axial · 3.0mm · 1.36mm/px · z∈[-103,+41]mm · 24 of 100 slices shown (1 of 2)]
[im 1/100]
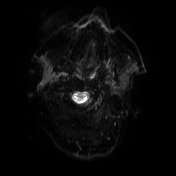
[im 5/100]
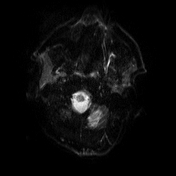
[im 9/100]
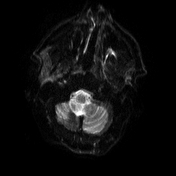
[im 13/100]
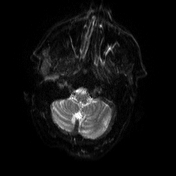
[im 18/100]
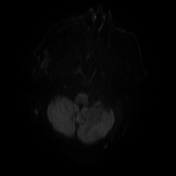
[im 22/100]
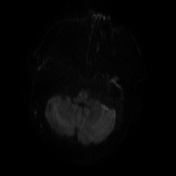
[im 26/100]
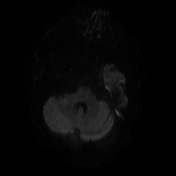
[im 31/100]
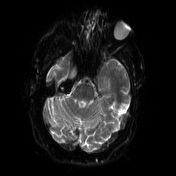
[im 35/100]
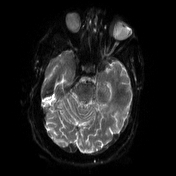
[im 39/100]
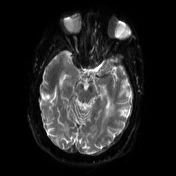
[im 44/100]
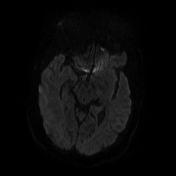
[im 48/100]
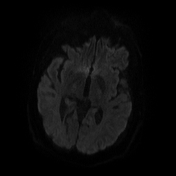
[im 52/100]
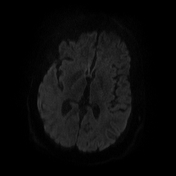
[im 56/100]
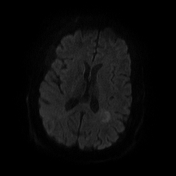
[im 61/100]
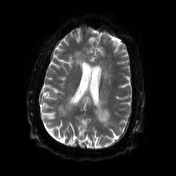
[im 65/100]
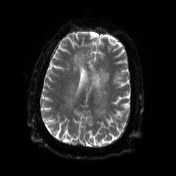
[im 69/100]
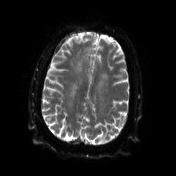
[im 74/100]
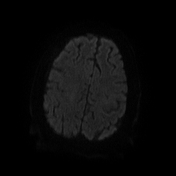
[im 78/100]
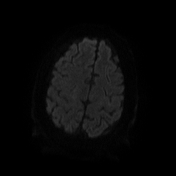
[im 82/100]
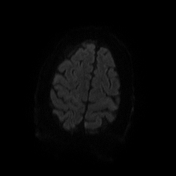
[im 87/100]
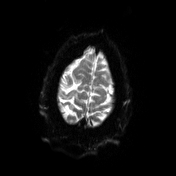
[im 91/100]
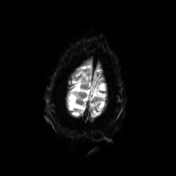
[im 95/100]
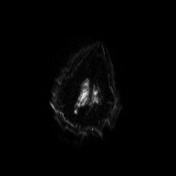
[im 100/100]
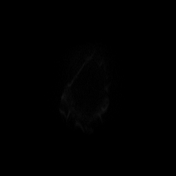

[Series 10: DWI · axial · 3.0mm · 1.36mm/px · z∈[-103,+41]mm · 12 of 50 slices shown (2 of 2)]
[im 1/50]
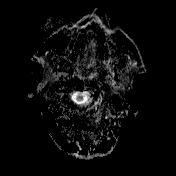
[im 5/50]
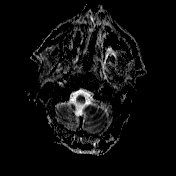
[im 9/50]
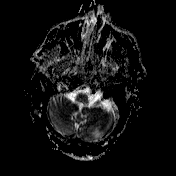
[im 14/50]
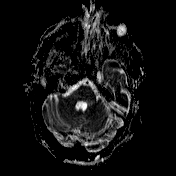
[im 18/50]
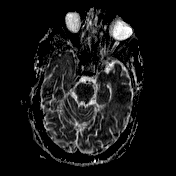
[im 23/50]
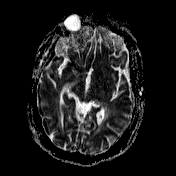
[im 27/50]
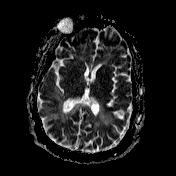
[im 32/50]
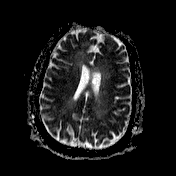
[im 36/50]
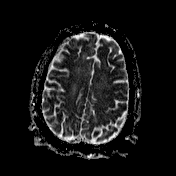
[im 41/50]
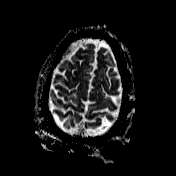
[im 45/50]
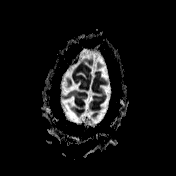
[im 50/50]
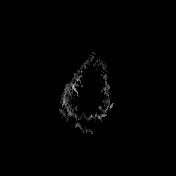

[Series 11: T1 · sagittal · 5.0mm · 0.75mm/px · 6 of 24 slices shown]
[im 1/24]
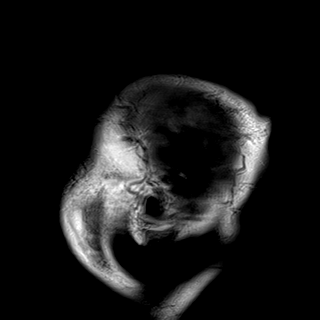
[im 5/24]
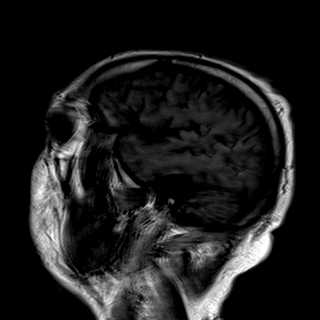
[im 10/24]
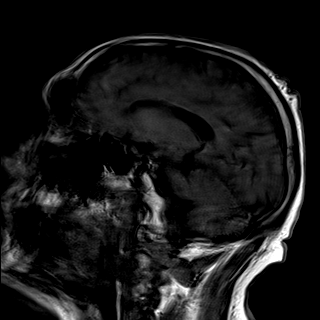
[im 14/24]
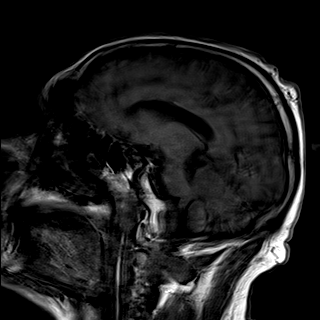
[im 19/24]
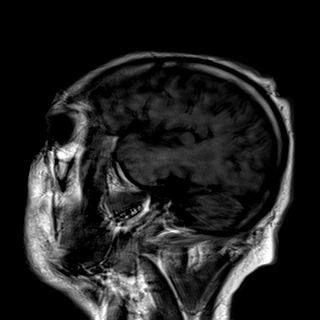
[im 24/24]
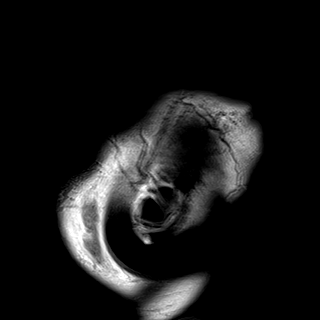

[Series 12: T2 · axial · 5.0mm · 0.62mm/px · z∈[-97,+62]mm · 6 of 26 slices shown]
[im 1/26]
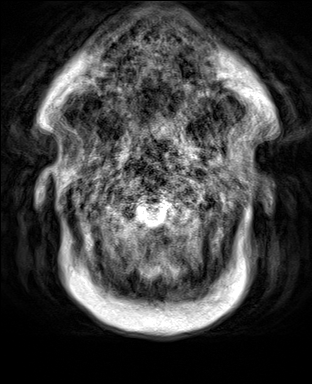
[im 6/26]
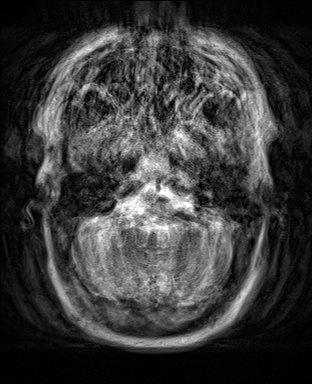
[im 11/26]
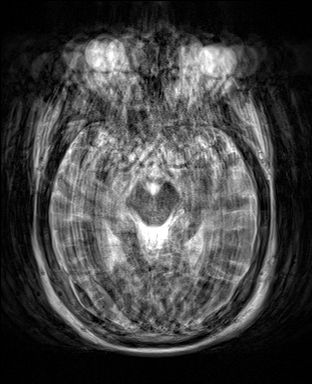
[im 16/26]
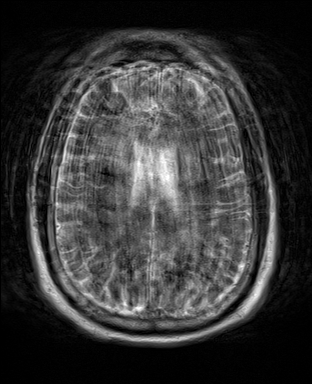
[im 21/26]
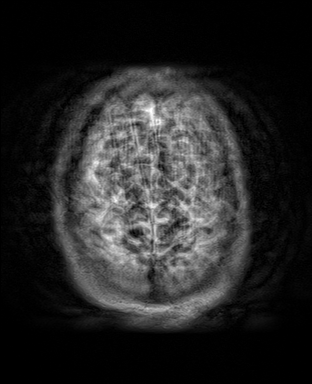
[im 26/26]
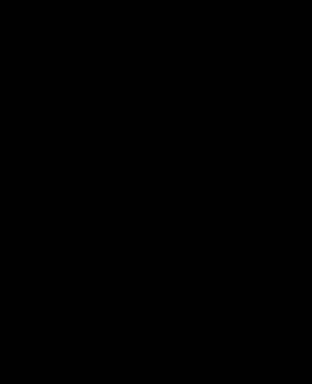

[48 of 48 positions shown; findings below may reference images not displayed]

FINDINGS: Examination is severely degraded by motion. The was terminated prior
to completion due to patient inability to cooperate with the
technologist's instructions.

MRI HEAD FINDINGS

There is a large hyperintense T2-weighted signal lesion within the
left parietal periatrial white matter. Otherwise, imaging quality is
too degraded to give much information.

MRI CERVICAL SPINE FINDINGS

There is no spinal canal stenosis. Cord parenchyma cannot be
assessed due to motion.
IMPRESSION: 1. Severely motion degraded and truncated examination.
2. Large hyperintense T2-weighted signal lesion within the left
parietal periatrial white matter. This may indicate a demyelinating
lesion or other inflammatory process.
3. No spinal canal stenosis. Cord parenchyma cannot be assessed due
to motion.

## 2022-03-25 IMAGING — MR MR THORACIC SPINE WO/W CM
8 of 9 series · 33 of 48 positions shown · IV contrast (10 ML GADAVIST)
Comparison: MRI brain [DATE]. MRI cervical spine [DATE].

CLINICAL DATA: Provided history: Multiple sclerosis. Additional
history provided: Patient diagnosed with multiple sclerosis in [41],
several falls.

EXAM:
MRI THORACIC WITHOUT AND WITH CONTRAST
TECHNIQUE: Multiplanar and multiecho pulse sequences of the thoracic spine were
obtained without and with intravenous contrast.
CONTRAST:  10mL GADAVIST GADOBUTROL 1 MMOL/ML IV SOLN

[Series 17: T1 · sagittal · 4.0mm · 1.72mm/px · 4 of 13 slices shown (1 of 3)]
[im 1/13]
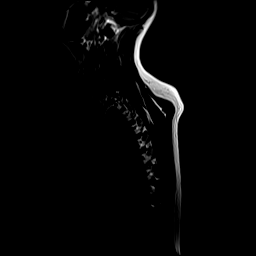
[im 5/13]
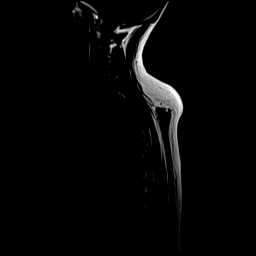
[im 9/13]
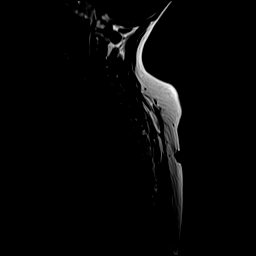
[im 13/13]
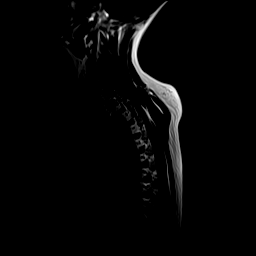

[Series 18: STIR · sagittal · 3.0mm · 1.09mm/px · 3 of 15 slices shown]
[im 1/15]
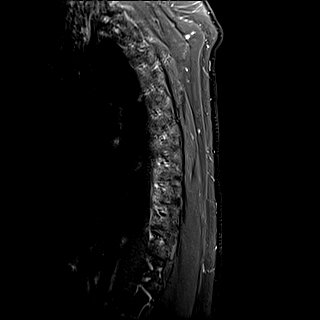
[im 8/15]
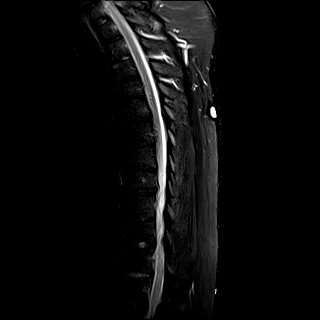
[im 15/15]
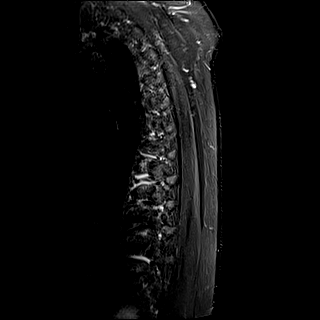

[Series 19: T1 · sagittal · 3.0mm · 1.09mm/px · 3 of 15 slices shown (2 of 3)]
[im 1/15]
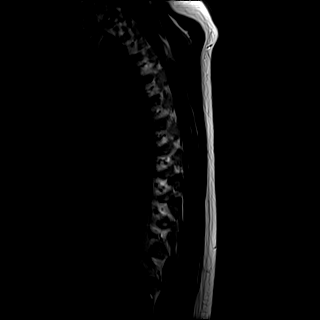
[im 8/15]
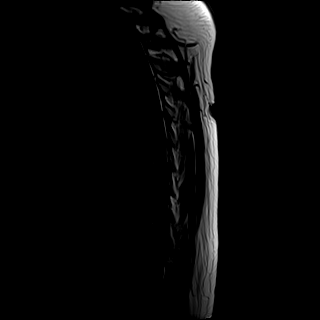
[im 15/15]
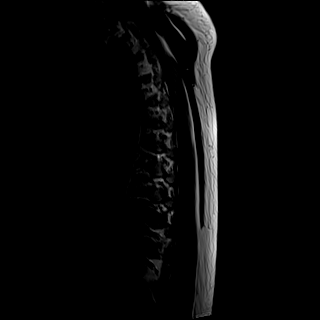

[Series 20: T2 · axial · 4.0mm · 0.78mm/px · z∈[-341,-87]mm · 8 of 39 slices shown]
[im 1/39]
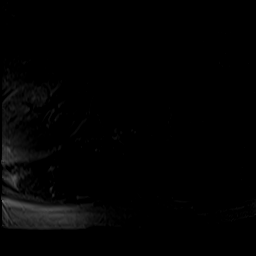
[im 6/39]
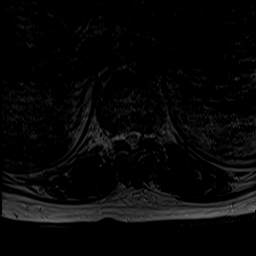
[im 11/39]
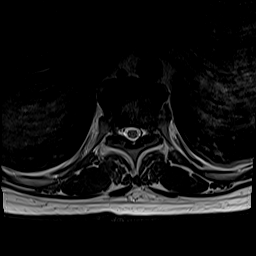
[im 17/39]
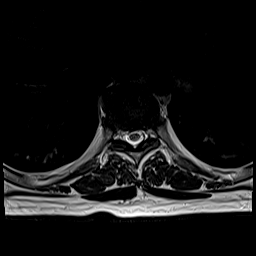
[im 22/39]
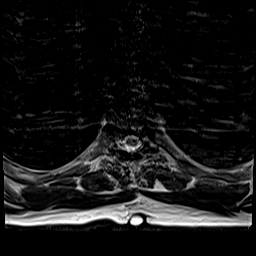
[im 28/39]
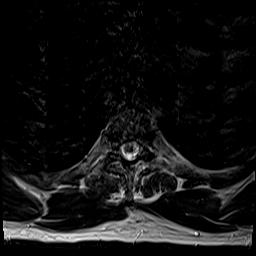
[im 33/39]
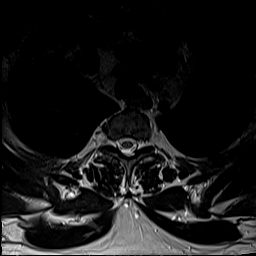
[im 39/39]
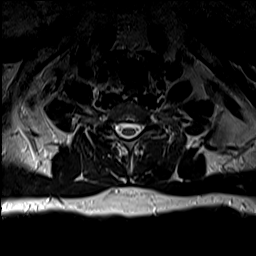

[Series 22: T1 · axial · 4.0mm · 0.39mm/px · z∈[-341,-87]mm · 8 of 39 slices shown (3 of 3)]
[im 1/39]
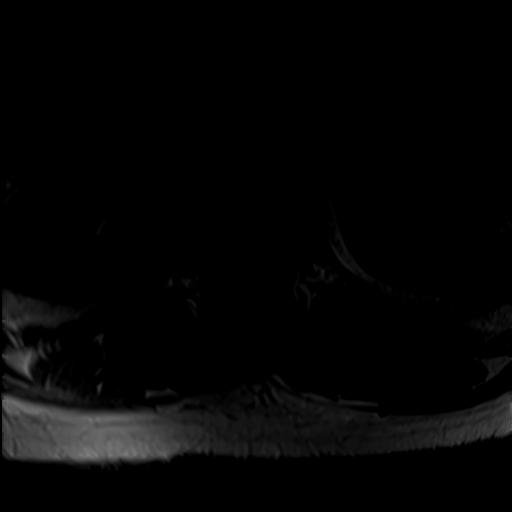
[im 6/39]
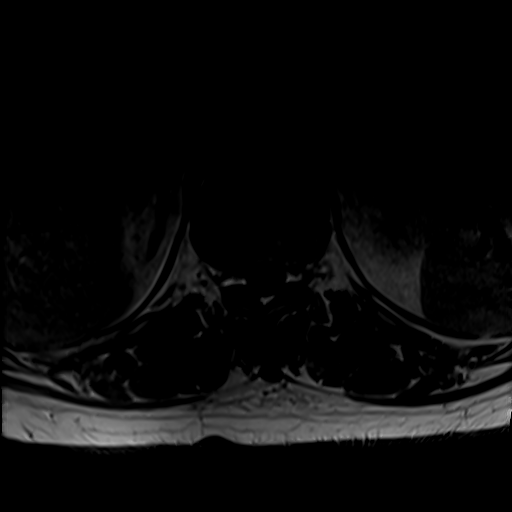
[im 11/39]
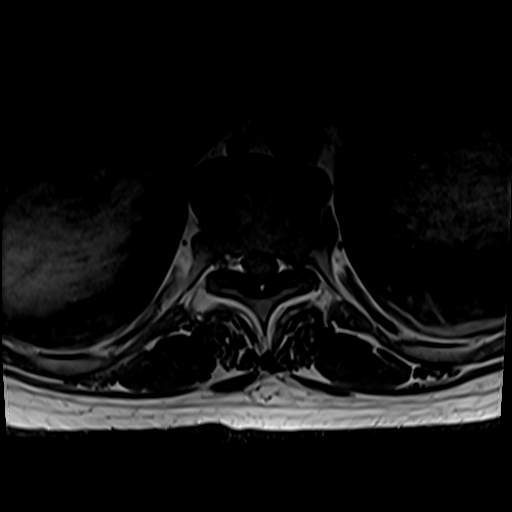
[im 17/39]
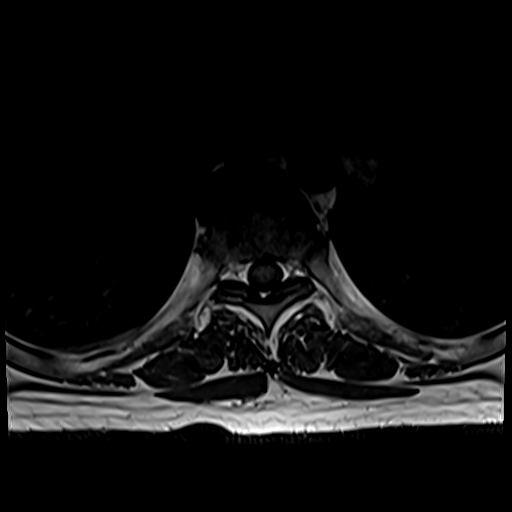
[im 22/39]
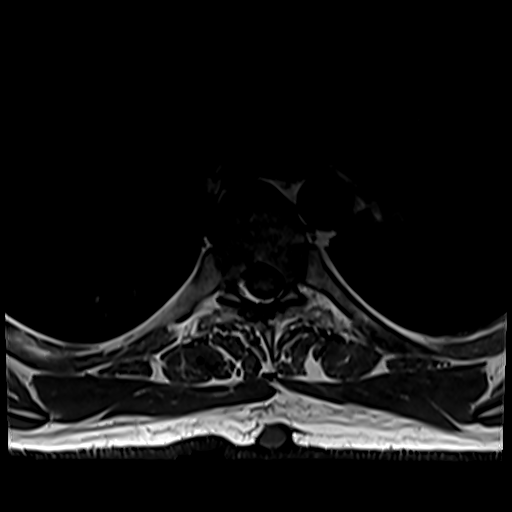
[im 28/39]
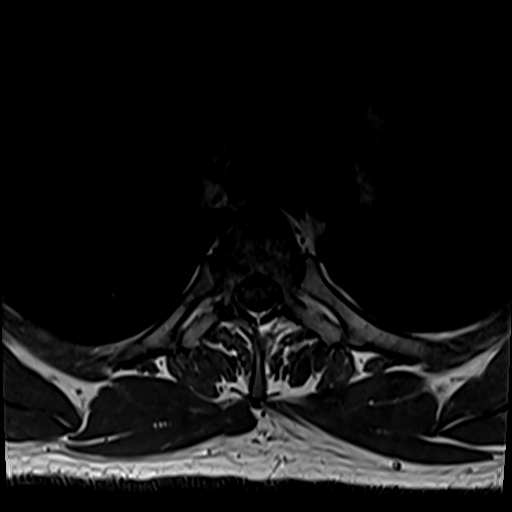
[im 33/39]
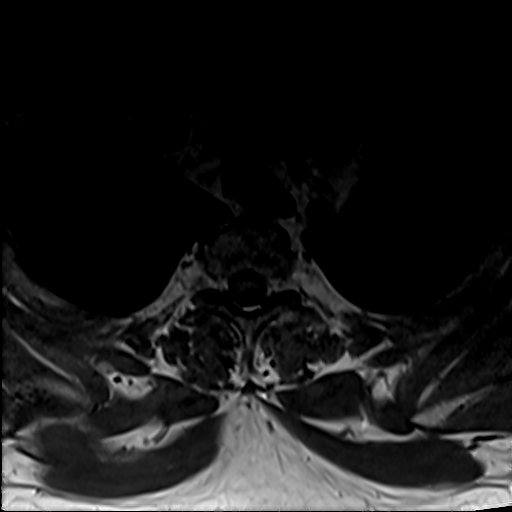
[im 39/39]
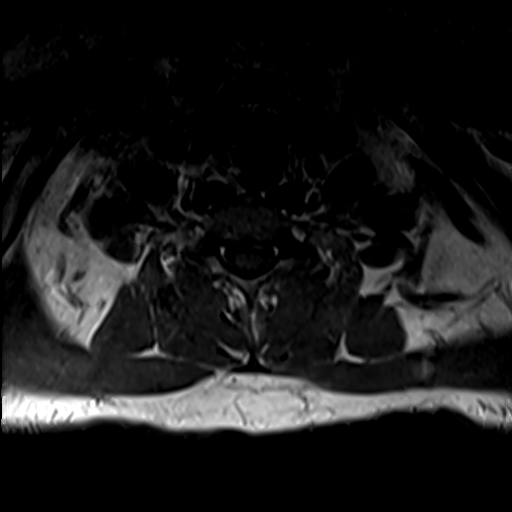

[Series 23: T2 post-contrast · sagittal · 3.0mm · 0.91mm/px · 3 of 15 slices shown]
[im 1/15]
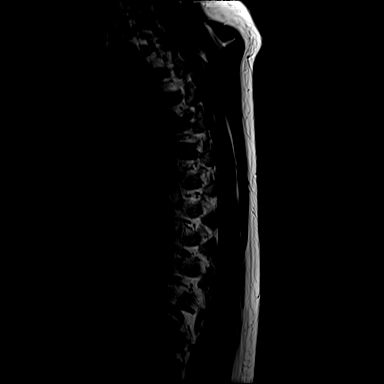
[im 8/15]
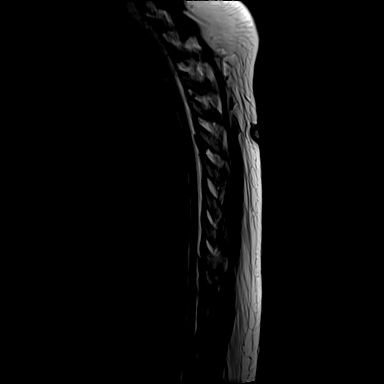
[im 15/15]
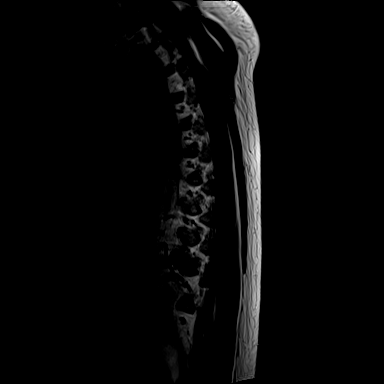

[Series 24: T1 fat-sat post-contrast · sagittal · 3.0mm · 1.37mm/px · 3 of 15 slices shown]
[im 1/15]
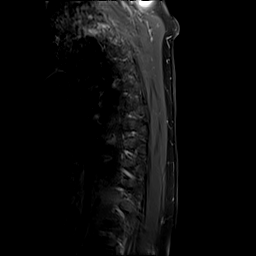
[im 8/15]
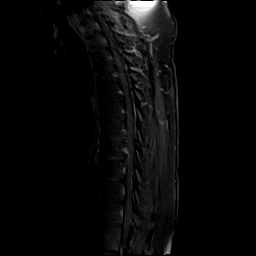
[im 15/15]
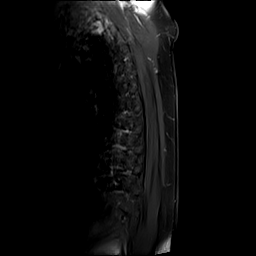

[Series 25: T1 post-contrast · axial · 4.0mm · 0.39mm/px · 1 of 39 slices shown]
[im 1/39]
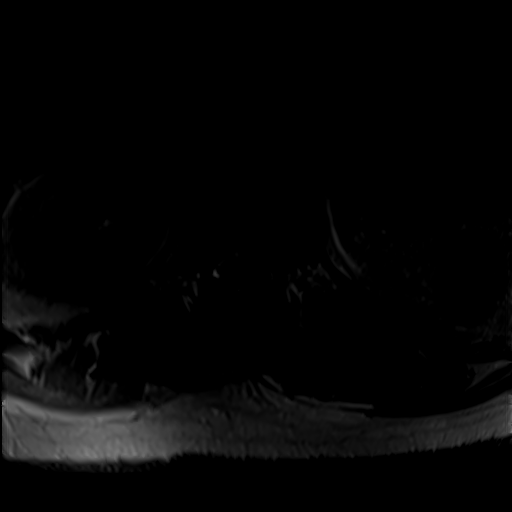

[33 of 48 positions shown; findings below may reference images not displayed]

FINDINGS: The examination is significantly motion degraded, limiting
evaluation. Most notably, there is moderate motion degradation of
the sagittal T2 TSE sequence, moderate motion degradation of the
sagittal STIR sequence, severe motion degradation of the axial T2
TSE sequence, severe motion degradation of the axial T2 GRE sequence
and moderate motion degradation of the sagittal T1 weighted
post-contrast fat saturated sequence.

Alignment:  No significant spondylolisthesis.

Vertebrae: Mild chronic anterior wedge deformity of the T12
vertebral body. Vertebral body height is otherwise maintained.
Multilevel degenerative endplate irregularity. No significant marrow
edema or focal suspicious osseous lesion.

Cord: Suspected short-segment focus of T2 hyperintense signal
abnormality within the left aspect of the spinal cord at the T8-T9
level (for instance as seen on series 18, image 10) (series 20,
image 25). No definite signal abnormality identified elsewhere
within the thoracic spinal cord, although motion degradation
significantly limits evaluation. No appreciable pathologic spinal
cord enhancement.

Paraspinal and other soft tissues: Nonspecific 16 mm peripherally
enhancing, cystic, cutaneous/subcutaneous lesion within the midline
back at the T5 level (series 24, image 10) (series 18, image 9).

Disc levels:

Multilevel disc degeneration. Disc degeneration is greatest at T4-T5
and T5-T6 (moderate at these levels). Multilevel disc bulges. At
T8-T9, a small right center disc protrusion focally effaces the
ventral thecal sac, contacting and minimally flattening the right
ventral aspect of the spinal cord. However, the dorsal CSF space is
maintained within the spinal canal. No significant spinal canal
stenosis at the remaining levels. No significant foraminal stenosis.
IMPRESSION: Significantly motion degraded examination, as described and limiting
evaluation.

Suspected short-segment focus of T2 hyperintense signal abnormality
within the left aspect of the spinal cord at the T8-T9 level, likely
reflecting a demyelinating lesion given the provided history.

No lesions are identified elsewhere within the thoracic spinal cord,
although motion degradation significantly limits evaluation. No
appreciable pathologic spinal cord enhancement.

Thoracic spondylosis, as described. Most notably at T8-T9, a small
right center disc protrusion focally effaces the ventral thecal sac,
contacting and minimally flattening the ventral aspect of the spinal
cord. However, the dorsal CSF space is maintained within the spinal
canal.

Mild chronic T12 anterior wedge vertebral compression deformity.

Nonspecific 16 mm cystic-appearing cutaneous/subcutaneous within the
midline back at the T5 level. Direct visualization is recommended.

## 2022-03-25 MED ORDER — VITAMIN B-12 1000 MCG PO TABS
1000.0000 ug | ORAL_TABLET | Freq: Every day | ORAL | Status: DC
  Administered 2022-03-25 – 2022-03-29 (×5): 1000 ug via ORAL
  Filled 2022-03-25 (×5): qty 1

## 2022-03-25 MED ORDER — LORAZEPAM 2 MG/ML IJ SOLN
1.0000 mg | Freq: Once | INTRAMUSCULAR | Status: AC
  Administered 2022-03-26: 1 mg via INTRAVENOUS
  Filled 2022-03-25: qty 1

## 2022-03-25 NOTE — Progress Notes (Signed)
PROGRESS NOTE    Shane Ramirez  BMW:[id number] DOB: 25-Feb-1986 DOA: 03/24/2022 PCP: Pcp, No     Brief Narrative:   Shane Ramirez is a 36 y.o. male with medical history significant of MS diagnosed in 2022, not on any medication, reported has been following for several month, is from [place], came to [place] a month ago now homeless, bystanders called ambulance due to seeing him falling on the street, he is brought to the ED, this is the fifth ED visit in the past week due to falls  Subjective:   Remain weak , reports it is July Still awaiting for MRI  Assessment & Plan:  Principal Problem:   Falls Active Problems:   Multiple sclerosis (HCC)   Homeless  Falls 5th ED visit in a week due to falls With h/o MS,  Report was diagnosed with MS in [place] in 2022 , obtain record , order placed   B12 level in 300 range , low CK level Pt/OT Neurology recommend MRI brain c spine/t spine w wo contrast Will follow Neurology input    Homeless Social worker consult   I have Reviewed nursing notes, Vitals, pain scores, I/o's, Lab results and  imaging results since pt's last encounter, details please see discussion above  I ordered the following labs:  Unresulted Labs (From admission, onward)     Start     Ordered   03/31/22 0500  Creatinine, serum  (enoxaparin (LOVENOX)    CrCl >/= 30 ml/min)  Weekly,   R     Comments: while on enoxaparin therapy    03/24/22 1714             DVT prophylaxis: enoxaparin (LOVENOX) injection 40 mg Start: 03/24/22 1800 SCDs Start: 03/24/22 1715   Code Status:   Code Status: Full Code  Family Communication:  State both parents passed away from MS, has 2 brothers he has no contact with Disposition:   Status is: Observation    Dispo: The patient is from: homeless               Anticipated d/c is to: TBD              Anticipated d/c date is: TBD  Antimicrobials:    Anti-infectives (From admission, onward)    None           Objective: Vitals:   03/24/22 1846 03/24/22 2256 03/25/22 0644 03/25/22 1300  BP: (!) 144/76 117/66 (!) 94/56 119/60  Pulse: 84 90 75 75  Resp: 17 18 18 18   Temp: 99.1 F (37.3 C) 97.6 F (36.4 C) 97.9 F (36.6 C) (!) 97.5 F (36.4 C)  TempSrc: Oral Oral Oral Oral  SpO2: 98% 98% 99% 98%  Weight:      Height:        Intake/Output Summary (Last 24 hours) at 03/25/2022 1918 Last data filed at 03/25/2022 1215 Gross per 24 hour  Intake 1230 ml  Output 1300 ml  Net -70 ml   Filed Weights   03/24/22 1534  Weight: 102.1 kg    Examination:  General exam: flat affect ,reports has a college degree, report is July Respiratory system: Clear to auscultation. Respiratory effort normal. Cardiovascular system:  RRR.  Gastrointestinal system: Abdomen is nondistended, soft and nontender.  Normal bowel sounds heard. Central nervous system: Alert and oriented.  Left lower extremity weakness 4/5 Extremities:  no edema Skin: No rashes, lesions or ulcers Psychiatry: Flat affect, denies SI /HI    Data Reviewed:  I have personally reviewed  labs and visualized  imaging studies since the last encounter and formulate the plan        Scheduled Meds:  enoxaparin (LOVENOX) injection  40 mg Subcutaneous Q24H   vitamin B-12  1,000 mcg Oral Daily   Continuous Infusions:   LOS: 0 days     Albertine Grates, MD PhD FACP [facility]  Available via Epic secure chat 7am-7pm for nonurgent issues Please page for urgent issues To page the attending provider between 7A-7P or the covering provider during after hours 7P-7A, please log into the web site [email or web address] and access using universal [place] password for that web site. If you do not have the password, please call the hospital operator.    03/25/2022, 7:18 PM

## 2022-03-25 NOTE — Plan of Care (Signed)
  Problem: Education: Goal: Knowledge of General Education information will improve Description: Including pain rating scale, medication(s)/side effects and non-pharmacologic comfort measures Outcome: Progressing   Problem: Activity: Goal: Risk for activity intolerance will decrease Outcome: Progressing   Problem: Nutrition: Goal: Adequate nutrition will be maintained Outcome: Progressing   

## 2022-03-26 ENCOUNTER — Observation Stay (HOSPITAL_COMMUNITY): Payer: Self-pay

## 2022-03-26 DIAGNOSIS — R296 Repeated falls: Secondary | ICD-10-CM

## 2022-03-26 DIAGNOSIS — R29898 Other symptoms and signs involving the musculoskeletal system: Secondary | ICD-10-CM

## 2022-03-26 MED ORDER — GADOBUTROL 1 MMOL/ML IV SOLN
10.0000 mL | Freq: Once | INTRAVENOUS | Status: AC | PRN
Start: 2022-03-26 — End: 2022-03-26
  Administered 2022-03-26: 10 mL via INTRAVENOUS

## 2022-03-26 NOTE — Consult Note (Addendum)
Neurology Consult H&P  Shane Ramirez MR# [id number] 03/26/2022   CC: MS flair  History is obtained from: Patient and chart.  HPI: Shane Ramirez is a 36 y.o. male PMHx as reviewed below diagnosed with MS summer 2022 while living in [place]. He has had periodic exacerbations however however recently started to fall such that the homeless shelter felt it was unsafe for him to continue to stay there and started to live outside.  On diagnosis he was treated with steroids however, he never started disease modifying agents.  The following information was taken from Dr. Lorain Childes note 03/26/2022:  "Patient was brought to the ED after falling while walking outside.   Patient is originally from [place] and was diagnosed with multiple sclerosis in 2022 after developing lower extremity symptoms.  He was treated with IV steroids at that time and was discharged.  He has lost follow-up and was never started on disease modifying therapy.   He moved to [place] and then to [place] a month ago trying to find his brother.  But he found that his brother is no longer here.  Patient does not want to stay locally and plans to move back once he is able to.  Patient states his symptoms and weakness have not gotten worse since his diagnosis in 2022 but also mentions that he has been falling more frequent lately. On 6/10, he was noted to be falling while in a bus stop, bystanders called ambulance and was brought to the ED."  ROS: A complete ROS was performed and is negative except as noted in the HPI.   Past Medical History:  Diagnosis Date   Multiple sclerosis (HCC)     History reviewed. No pertinent family history.  Social History:  reports that he has never smoked. He has never been exposed to tobacco smoke. He has never used smokeless tobacco. He reports that he does not currently use alcohol. He reports that he does not currently use drugs.   Prior to Admission medications   Not on File     Exam: Current vital signs: BP 123/79 (BP Location: Right Arm)   Pulse 64   Temp 97.6 F (36.4 C) (Oral)   Resp 17   Ht 6\' 4"  (1.93 m)   Wt 102.1 kg   SpO2 100%   BMI 27.39 kg/m   Physical Exam  Constitutional: Appears well-developed and well-nourished.  Psych: Affect appropriate to situation Eyes: No scleral injection HENT: No OP obstruction. Head: Normocephalic.  Cardiovascular: Normal rate and regular rhythm.  Respiratory: Effort normal, symmetric excursions bilaterally, no audible wheezing. GI: Soft.  No distension. There is no tenderness.  Skin: WDI  Neuro: Mental Status: Patient is awake, alert, oriented to person, place, month, year, and situation. Patient is able to give a clear and coherent history. Speech fluent, intact comprehension and repetition. No signs of aphasia or neglect. Visual Fields are full. Pupils are equal, round, and reactive to light. EOMI mild right ptosis, no diplopia.  Facial sensation is symmetric to temperature Facial movement is symmetric.  Hearing is intact to voice. Uvula midline and palate elevates symmetrically. Shoulder shrug is symmetric. Tongue is midline without atrophy or fasciculations.  Tone is normal. Bulk is normal. ~5/5 strength in bilateral upper and right lower extremities;4-/5 on right lower extremity. Sensation is symmetric to light touch and temperature in the arms and legs. Deep Tendon Reflexes: Brisk on right patella, ~2+ and symmetric in the biceps. Toes are downgoing bilaterally. FNF and HKS are intact  bilaterally. Gait - Deferred  I have reviewed labs in epic and the pertinent results are: Na 142  I have reviewed the images obtained: MRI brain severely motion degraded and truncated examination but showed large hyperintense T2-weighted signal lesion within the left parietal periatrial white matter.   MRI thoracic spine with contrast showed short-segment focus of T2 hyperintense signal abnormality within  the left aspect of the spinal cord at the T8-T9 level spondylosis and right center disc protrusion at same level.  Assessment: Shane Ramirez is a 36 y.o. male PMHx multiple sclerosis with significant left lower extremity weakness and MRI showing short-segment T2 changes without enhancement. He has never been on disease modifying agent as he is homeless and will need outpatient follow up.   Recommend starting solumedrol 1,000mg  daily for 5 days - because of the significant weakness and he is homeless and may be lost to follow up. Repeat MRI brain and cervical spine with contrast and please provide sedation. PPI PT/OT Social work consult Fall precautions. Will continue to follow.   Electronically signed by:  Marisue Humble, MD Page: [phone] 03/26/2022, 9:08 PM  If 7pm- 7am, please page neurology on call as listed in AMION.

## 2022-03-26 NOTE — Progress Notes (Signed)
PROGRESS NOTE  Shane Ramirez  DOB: [birth year]  PCP: Aviva Kluver [facility]:[id number]  DOA: 03/24/2022  LOS: 0 days  Hospital Day: 3  Brief narrative: Shane Ramirez is a 36 y.o. male with PMH significant for multiple sclerosis, diagnosed in 2022 not on any medicines Patient was brought to the ED after falling while walking outside.  Patient is originally from [place] and was diagnosed with multiple sclerosis in 2022 after developing lower extremity symptoms.  He was treated with IV steroids at that time and was discharged.  He has lost follow-up and was never started on disease modifying therapy.   He moved to [place] and then to [place] a month ago trying to find his brother.  But he found that his brother is no longer here.  Patient does not want to stay locally and plans to move back once he is able to.  Patient states his symptoms and weakness have not gotten worse since his diagnosis in 2022 but also mentions that he has been falling more frequent lately. On 6/10, he was noted to be falling while in a bus stop, bystanders called ambulance and was brought to the ED.  Admitted to hospitalist service Neurology consultation was obtained. See below for details  Subjective: Patient was seen and examined this morning.  Pleasant, young Caucasian male.  Drowsy from the effect of Ativan given earlier for MRI  Principal Problem:   Falls Active Problems:   Multiple sclerosis [facility])   Homeless    Assessment and plan: Worsening falls History of multiple sclerosis -He was admitted after his fifth visit in the ED for falls. -Neurology consultation was obtained.  MRI brain without contrast, MRI cervical spine without contrast and MR thoracic spine obtained. -We will discuss with neurology.  Currently not on steroids. -Obtain records from previous hospitalization.   Homeless Social worker consult   Goals of care   Code Status: Full Code    Mobility: PT/OT eval  Skin assessment:      Nutritional status:  Body mass index is 27.39 kg/m.          Diet:  Diet Order             Diet regular Room service appropriate? Yes; Fluid consistency: Thin  Diet effective now                   DVT prophylaxis:  enoxaparin (LOVENOX) injection 40 mg Start: 03/24/22 1800 SCDs Start: 03/24/22 1715   Antimicrobials:  Fluid: None Consultants: Neurology Family Communication: None at bedside  Status is: Observation  Continue in-hospital care because: Pending imagings Level of care: Med-Surg   Dispo: The patient is from: Homeless              Anticipated d/c is to: Pending course              Patient currently is not medically stable to d/c.   Difficult to place patient No     Infusions:    Scheduled Meds:  enoxaparin (LOVENOX) injection  40 mg Subcutaneous Q24H   vitamin B-12  1,000 mcg Oral Daily    PRN meds:    Antimicrobials: Anti-infectives (From admission, onward)    None       Objective: Vitals:   03/25/22 2144 03/26/22 0411  BP: 124/70 123/79  Pulse: 72 64  Resp: 17 17  Temp: 98 F (36.7 C) 97.6 F (36.4 C)  SpO2: 99% 100%    Intake/Output Summary (Last 24 hours) at 03/26/2022  1556 Last data filed at 03/26/2022 1409 Gross per 24 hour  Intake 840 ml  Output 1000 ml  Net -160 ml   Filed Weights   03/24/22 1534  Weight: 102.1 kg   Weight change:  Body mass index is 27.39 kg/m.   Physical Exam: General exam: Young male.  Looks older for his stated age Skin: No rashes, lesions or ulcers. HEENT: Atraumatic, normocephalic, no obvious bleeding Lungs: Clear to auscultation bilaterally CVS: Regular rate and rhythm, no murmur GI/Abd soft, nontender, nondistended, bowel sound present CNS: Drowsy from the effect of IV Ativan given earlier for MRI, opens eyes on verbal command Psychiatry: Unable to examine because of altered mentation Extremities: No pedal edema, no calf tenderness  Data Review: I have personally reviewed the  laboratory data and studies available.  F/u labs ordered Unresulted Labs (From admission, onward)     Start     Ordered   03/31/22 0500  Creatinine, serum  (enoxaparin (LOVENOX)    CrCl >/= 30 ml/min)  Weekly,   R     Comments: while on enoxaparin therapy    03/24/22 1714            Signed, Lorin Glass, MD [facility] 03/26/2022

## 2022-03-27 ENCOUNTER — Inpatient Hospital Stay (HOSPITAL_COMMUNITY): Payer: Self-pay

## 2022-03-27 IMAGING — MR MR CERVICAL SPINE WO/W CM
9 of 10 series · 36 of 48 positions shown · IV contrast (10 ML GADAVIST)
Comparison: Prior study from [DATE].

CLINICAL DATA: Follow-up examination for multiple sclerosis,
myelopathy.

EXAM:
MRI CERVICAL SPINE WITHOUT AND WITH CONTRAST
TECHNIQUE: Multiplanar and multiecho pulse sequences of the cervical spine, to
include the craniocervical junction and cervicothoracic junction,
were obtained without and with intravenous contrast.
CONTRAST:  10mL GADAVIST GADOBUTROL 1 MMOL/ML IV SOLN

[Series 18: T1 · sagittal · 3.0mm · 0.69mm/px · 3 of 14 slices shown (1 of 2)]
[im 1/14]
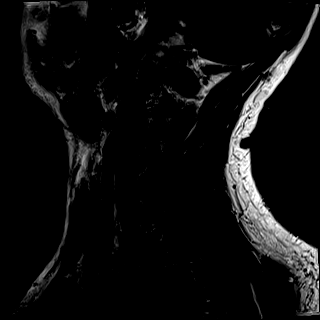
[im 7/14]
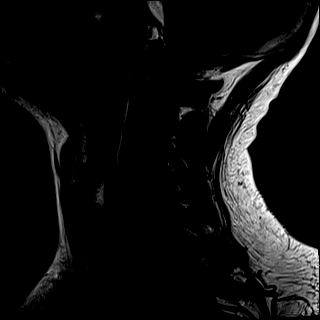
[im 14/14]
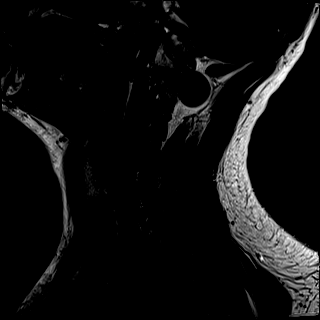

[Series 19: STIR · sagittal · 3.0mm · 0.86mm/px · 3 of 14 slices shown]
[im 1/14]
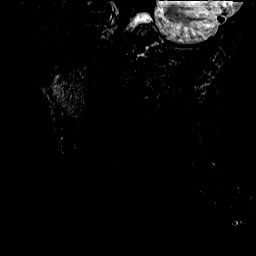
[im 7/14]
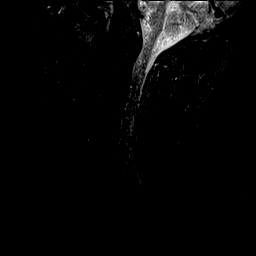
[im 14/14]
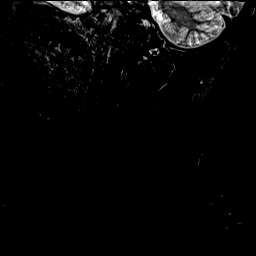

[Series 20: T2 · axial · 4.0mm · 0.70mm/px · z∈[-262,-147]mm · 6 of 26 slices shown]
[im 1/26]
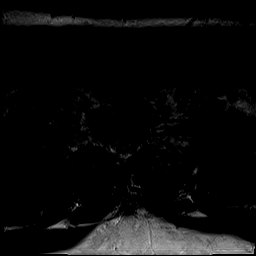
[im 6/26]
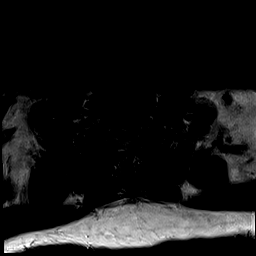
[im 11/26]
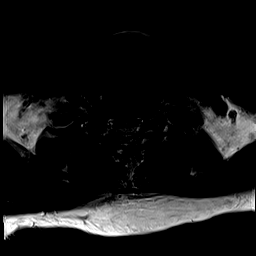
[im 16/26]
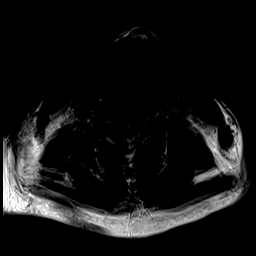
[im 21/26]
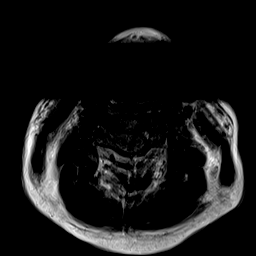
[im 26/26]
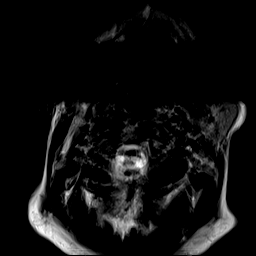

[Series 22: T1 · axial · 4.0mm · 0.35mm/px · z∈[-262,-147]mm · 7 of 26 slices shown (2 of 2)]
[im 1/26]
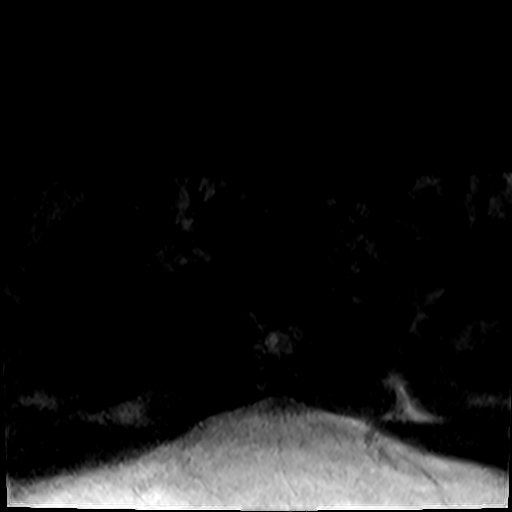
[im 5/26]
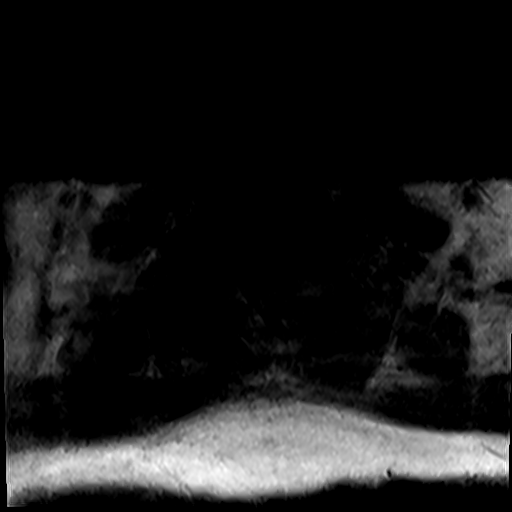
[im 9/26]
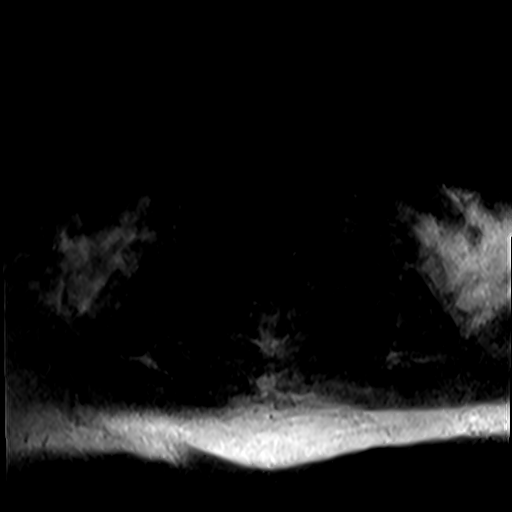
[im 13/26]
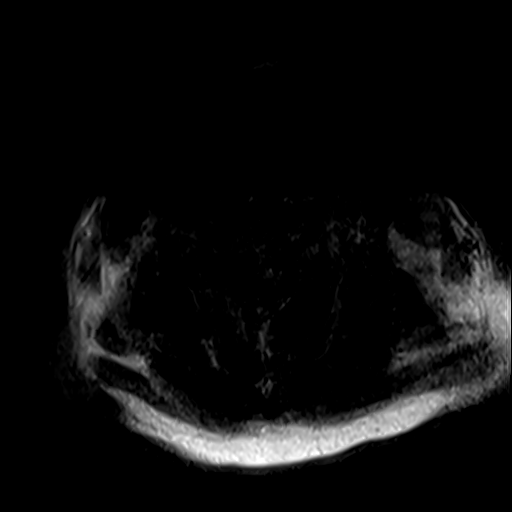
[im 17/26]
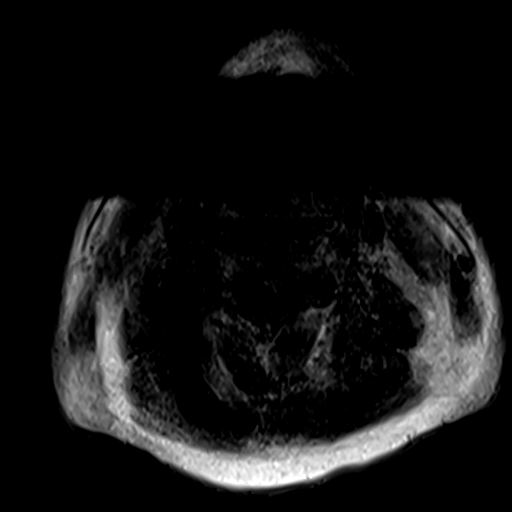
[im 21/26]
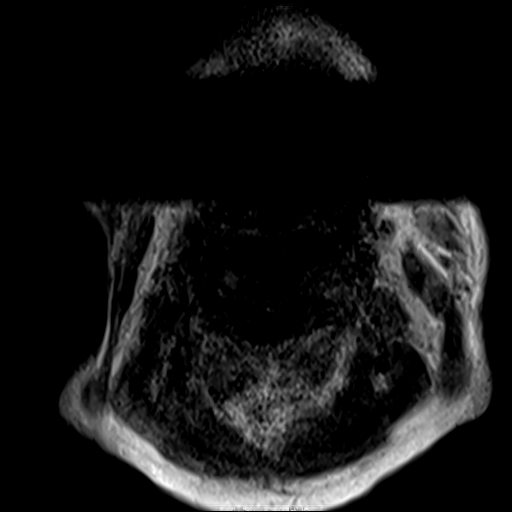
[im 26/26]
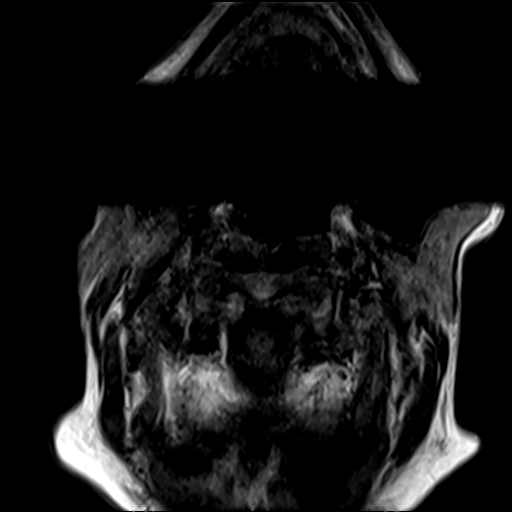

[Series 23: T2 post-contrast · sagittal · 3.0mm · 0.69mm/px · 4 of 14 slices shown (1 of 2)]
[im 1/14]
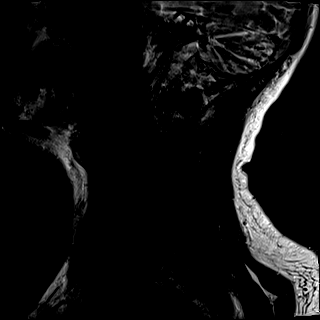
[im 5/14]
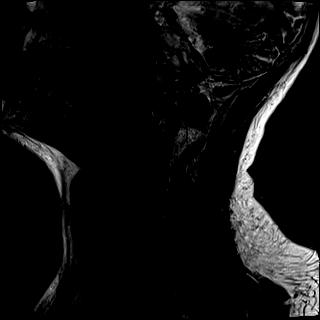
[im 9/14]
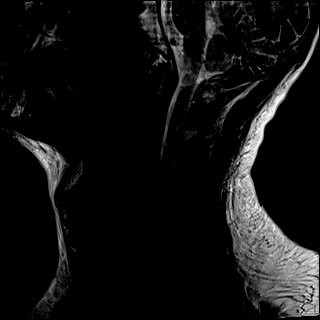
[im 14/14]
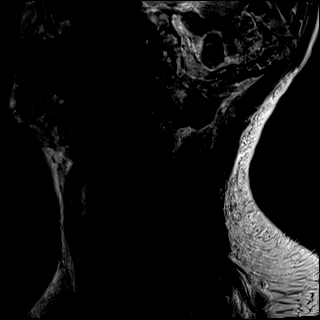

[Series 24: T1 fat-sat post-contrast · sagittal · 3.0mm · 0.69mm/px · 4 of 14 slices shown (1 of 2)]
[im 1/14]
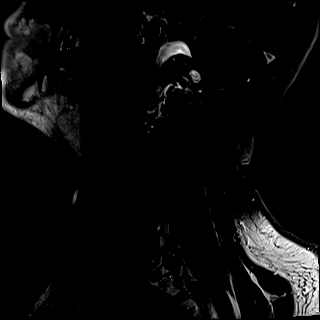
[im 5/14]
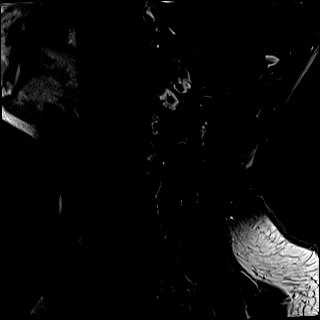
[im 9/14]
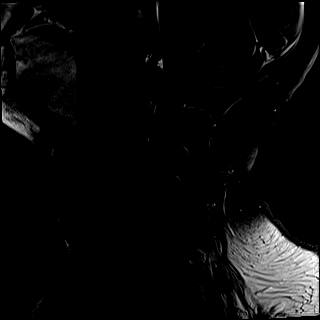
[im 14/14]
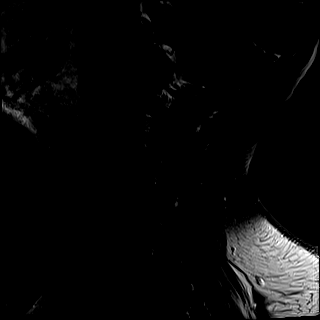

[Series 25: T2 post-contrast · sagittal · 3.0mm · 0.69mm/px · 4 of 14 slices shown (2 of 2)]
[im 1/14]
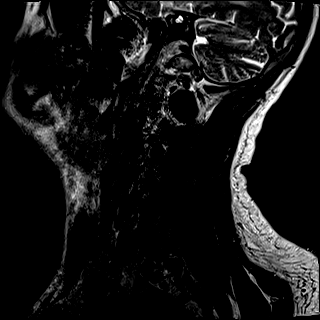
[im 5/14]
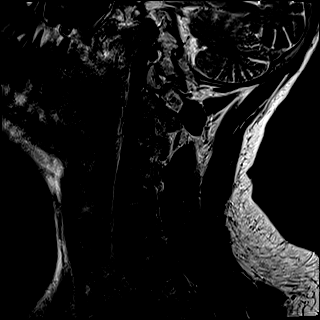
[im 9/14]
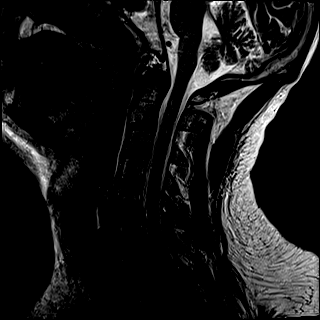
[im 14/14]
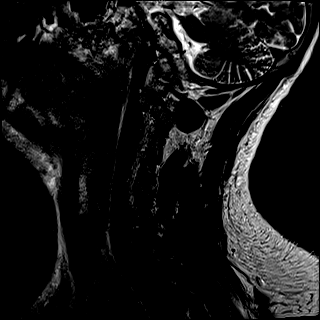

[Series 26: T1 fat-sat post-contrast · sagittal · 3.0mm · 0.69mm/px · 4 of 14 slices shown (2 of 2)]
[im 1/14]
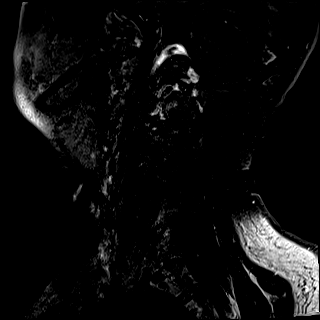
[im 5/14]
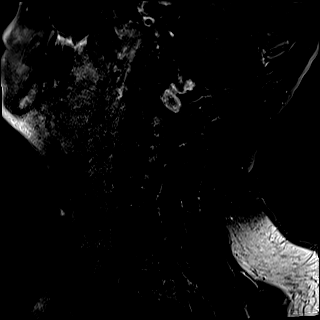
[im 9/14]
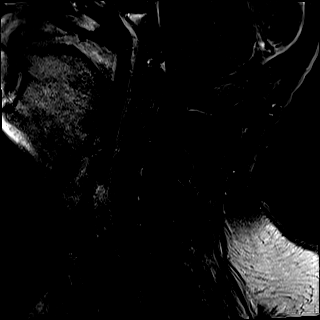
[im 14/14]
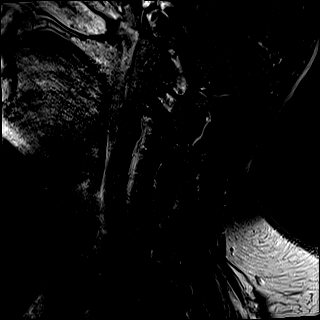

[Series 27: T1 post-contrast · axial · 4.0mm · 0.35mm/px · 1 of 26 slices shown]
[im 1/26]
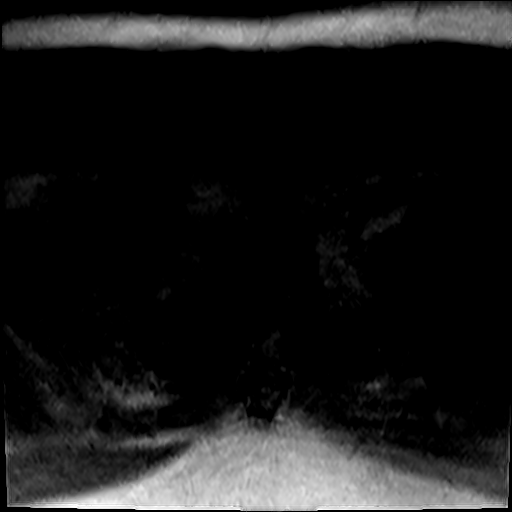

[36 of 48 positions shown; findings below may reference images not displayed]

FINDINGS: Alignment: Examination is moderately to severely degraded by motion
artifact, limiting assessment.

Vertebral bodies normally aligned with preservation of the normal
cervical lordosis. No listhesis.

Vertebrae: Vertebral body height maintained without acute or chronic
fracture. Bone marrow signal intensity within normal limits. No
discrete or worrisome osseous lesions. No abnormal marrow edema or
enhancement.

Cord: Evaluation of the cervical spinal cord limited by motion. No
obvious cord signal changes to suggest demyelinating disease. No
abnormal enhancement.

Posterior Fossa, vertebral arteries, paraspinal tissues: Paraspinous
soft tissues grossly within normal limits.

Disc levels:

No more than mild multilevel noncompressive disc bulging noted
within the cervical spine. No significant spinal stenosis.
Evaluation for foraminal encroachment limited by motion.
IMPRESSION: 1. Technically limited exam due to extensive motion artifact. No
obvious cord signal changes to suggest demyelinating disease. No
abnormal enhancement.
2. Mild multilevel cervical spondylosis without significant
stenosis.

## 2022-03-27 IMAGING — MR MR HEAD WO/W CM
10 of 12 series · 30 of 48 positions shown · IV contrast (gadavist)
Comparison: Prior brain MRI from [DATE].

CLINICAL DATA: Follow-up examination for multiple sclerosis, lower
extremity weakness.

EXAM:
MRI HEAD WITHOUT AND WITH CONTRAST
TECHNIQUE: Multiplanar, multiecho pulse sequences of the brain and surrounding
structures were obtained without and with intravenous contrast.
CONTRAST:  10mL GADAVIST GADOBUTROL 1 MMOL/ML IV SOLN

[Series 8: T2 · sagittal · 5.0mm · 0.47mm/px · 3 of 24 slices shown (1 of 2)]
[im 1/24]
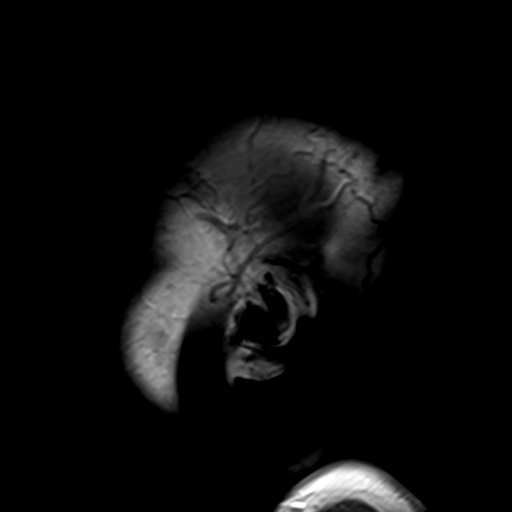
[im 12/24]
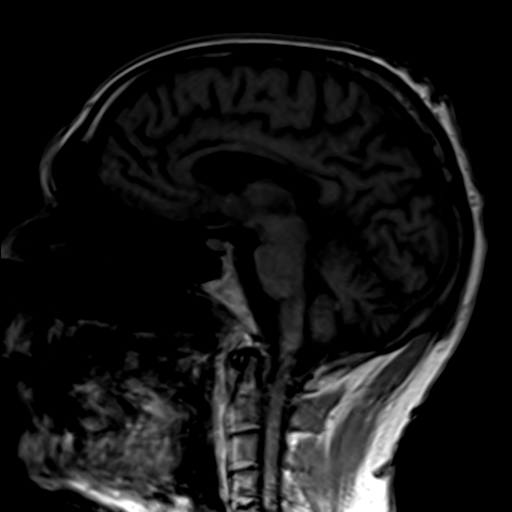
[im 24/24]
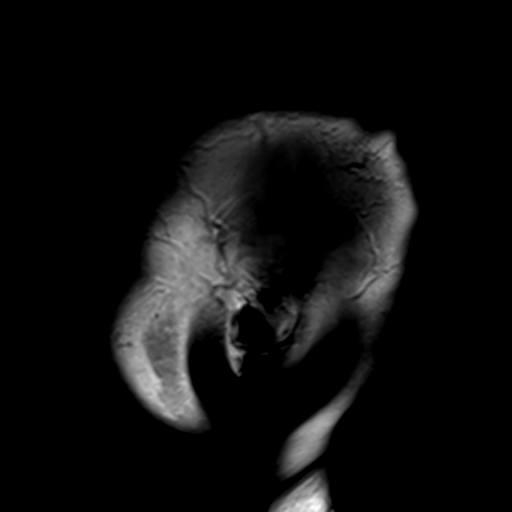

[Series 9: T2 · axial · 5.0mm · 0.45mm/px · z∈[-74,+83]mm · 3 of 26 slices shown (2 of 2)]
[im 1/26]
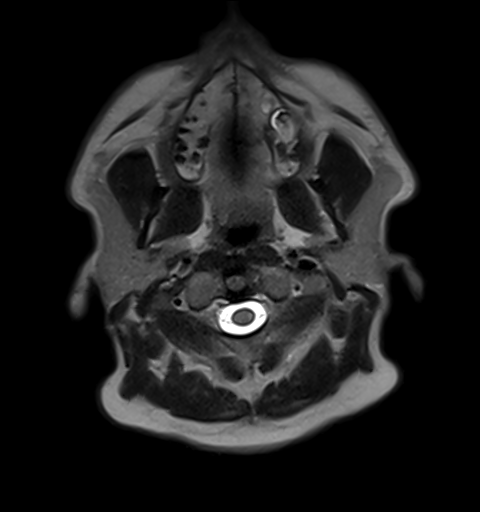
[im 13/26]
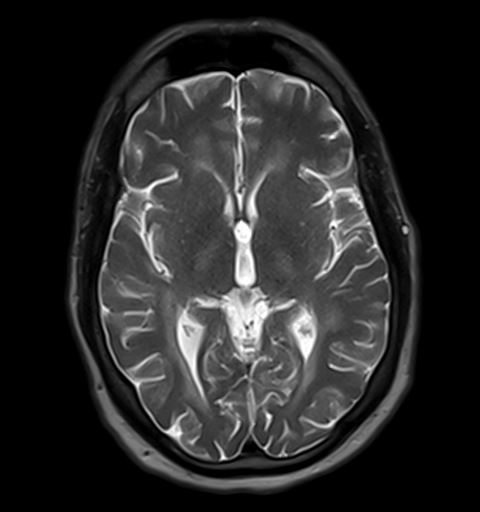
[im 26/26]
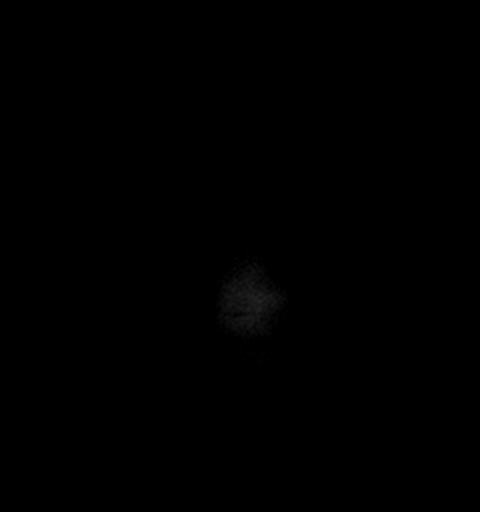

[Series 10: GRE · axial · 3.0mm · 0.45mm/px · z∈[-66,+76]mm · 4 of 50 slices shown]
[im 1/50]
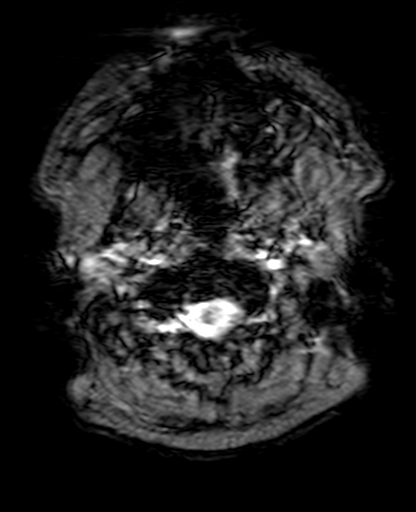
[im 17/50]
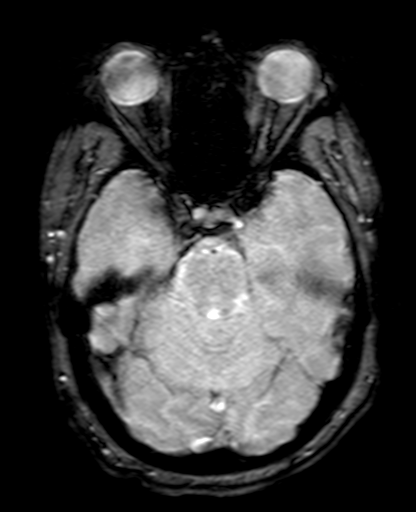
[im 33/50]
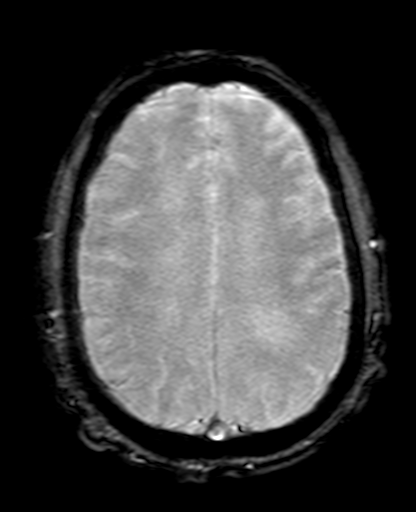
[im 50/50]
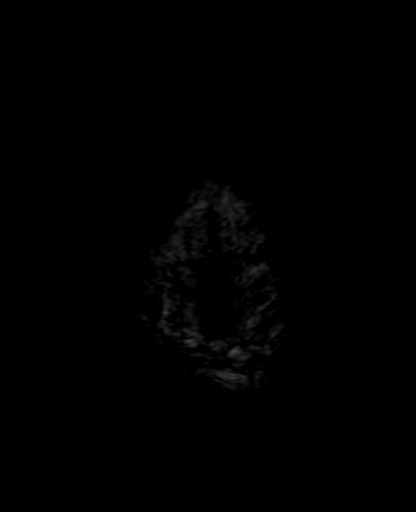

[Series 11: FLAIR · axial · 3.0mm · 0.86mm/px · z∈[-65,+72]mm · 4 of 48 slices shown]
[im 1/48]
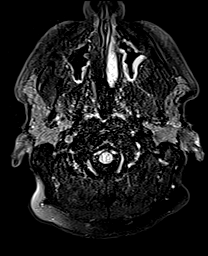
[im 16/48]
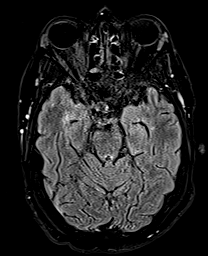
[im 32/48]
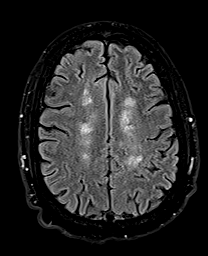
[im 48/48]
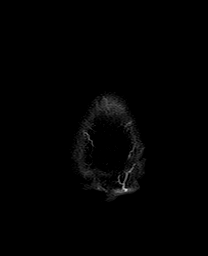

[Series 12: T1 · axial · 3.0mm · 0.45mm/px · 1 of 50 slices shown]
[im 1/50]
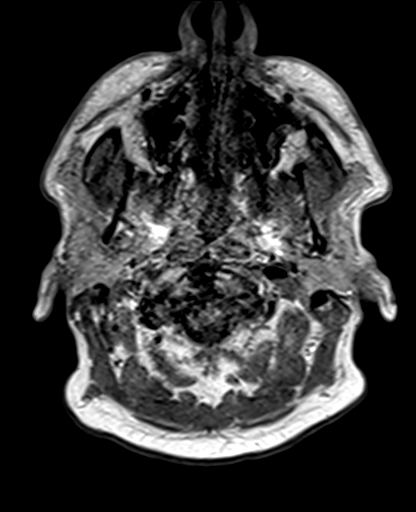

[Series 13: DWI · coronal · 5.0mm · 1.31mm/px · 5 of 56 slices shown (1 of 2)]
[im 1/56]
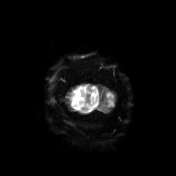
[im 14/56]
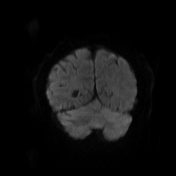
[im 28/56]
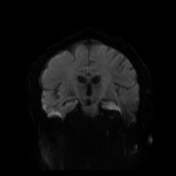
[im 42/56]
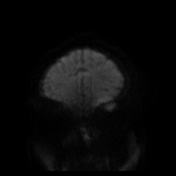
[im 56/56]
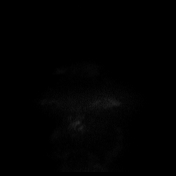

[Series 14: DWI · coronal · 5.0mm · 1.31mm/px · 2 of 28 slices shown (2 of 2)]
[im 1/28]
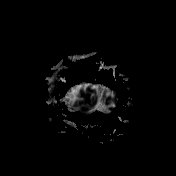
[im 28/28]
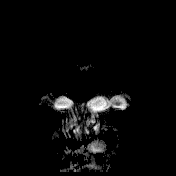

[Series 15: T2 post-contrast · coronal · 5.0mm · 0.86mm/px · 2 of 28 slices shown]
[im 1/28]
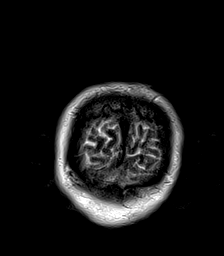
[im 28/28]
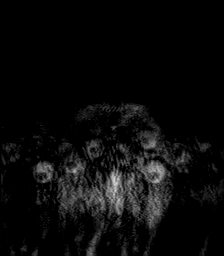

[Series 16: T1 post-contrast · axial · 3.0mm · 0.45mm/px · z∈[-66,+76]mm · 4 of 50 slices shown (1 of 2)]
[im 1/50]
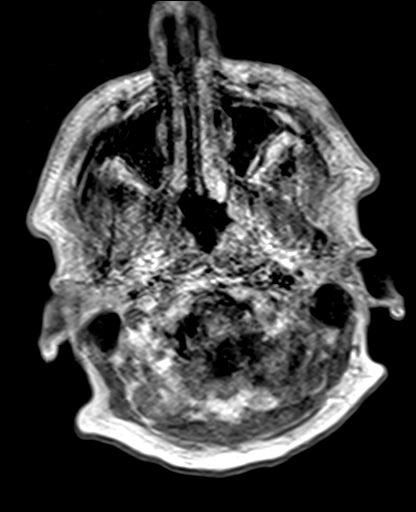
[im 17/50]
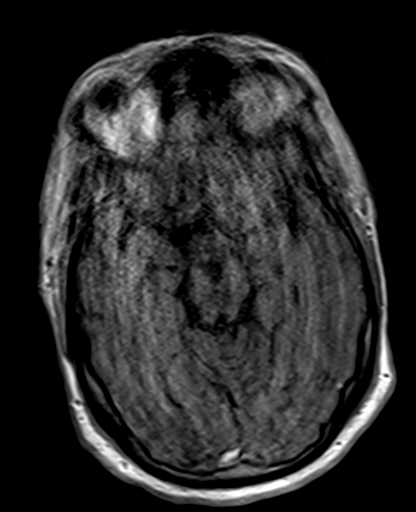
[im 33/50]
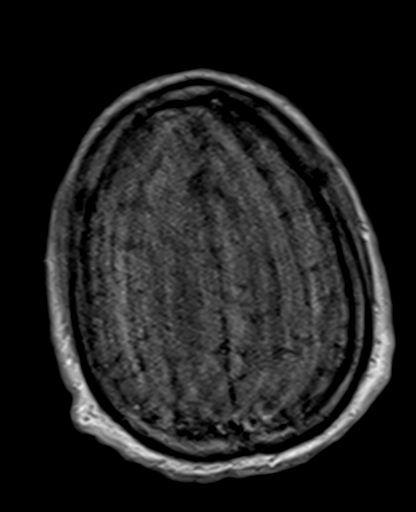
[im 50/50]
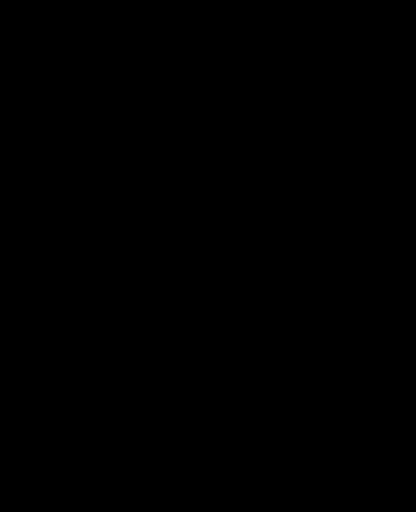

[Series 17: T1 post-contrast · coronal · 5.0mm · 0.43mm/px · 2 of 28 slices shown (2 of 2)]
[im 1/28]
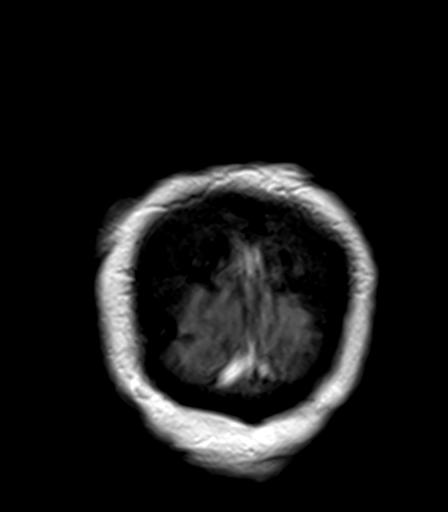
[im 28/28]
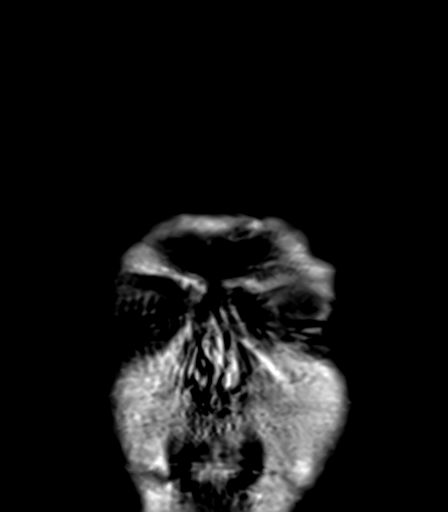

[30 of 48 positions shown; findings below may reference images not displayed]

FINDINGS: Brain: Current examination has been performed with a mildly improved
degree of motion as compared to previous, however, examination
remains fairly limited and motion degraded.

Mildly advanced cerebral atrophy for age. Patchy and confluent
T2/FLAIR hyperintensities involving the periventricular, deep, and
juxta cortical white matter of both cerebral hemispheres, consistent
with history of multiple sclerosis. The largest lesion again noted
at the posterior left periventricular white matter and measures up
to approximately 2.2 cm. Associated incomplete ring of diffusion
signal abnormality about this lesion is seen (series 6, image 79),
similar to previous. Probable associated subtle ADC correlate noted
as well (series 7, image 29). Although no clear associated
enhancement seen on the motion degraded postcontrast sequences,
overall appearance of the diffusion signal change suspected to
reflect a degree of associated active demyelination. No other clear
enhancing lesions or evidence for active demyelination elsewhere
within the brain.

No evidence for acute or subacute infarct. No areas of chronic
cortical infarction. No acute or chronic intracranial blood
products.

No mass lesion, mass effect or midline shift. No hydrocephalus or
extra-axial fluid collection. Pituitary gland suprasellar region
normal. Midline structures intact and normally formed. No other
appreciable abnormal enhancement.

Vascular: Major intracranial vascular flow voids are maintained at
the skull base.

Skull and upper cervical spine: Craniocervical junction within
normal limits. Bone marrow signal intensity grossly normal without
focal marrow replacing lesion. No scalp soft tissue abnormality.

Sinuses/Orbits: Globes and orbital soft tissues within normal
limits. Mild scattered mucosal thickening noted about the ethmoidal
air cells and maxillary sinuses. No mastoid effusion.

Other: None.
IMPRESSION: 1. Technically limited exam due to extension motion artifact.
2. Patchy and confluent T2/FLAIR hyperintensities involving the
supratentorial cerebral white matter, consistent with provided
history of multiple sclerosis. Associated incomplete ring of
diffusion signal abnormality about a dominant lesion at the left
parietal periventricular white matter suspected to reflect a degree
of active demyelination. No other definite or convincing evidence
for active demyelination elsewhere on this motion degraded exam.

## 2022-03-27 MED ORDER — SODIUM CHLORIDE 0.9 % IV SOLN: INTRAVENOUS | Status: DC | PRN

## 2022-03-27 MED ORDER — SODIUM CHLORIDE 0.9 % IV SOLN
1000.0000 mg | Freq: Every day | INTRAVENOUS | Status: DC
  Administered 2022-03-27 – 2022-03-28 (×2): 1000 mg via INTRAVENOUS
  Filled 2022-03-27 (×3): qty 16

## 2022-03-27 MED ORDER — LORAZEPAM 2 MG/ML IJ SOLN
2.0000 mg | Freq: Once | INTRAMUSCULAR | Status: DC | PRN
  Filled 2022-03-27: qty 1

## 2022-03-27 NOTE — Plan of Care (Signed)
Neurology Plan of Care  Shane Ramirez MR# [id number] 03/27/2022  Patient was seen today and reports on improvement. Day 1/5 solumedrol. repeat MRI ordered   Electronically signed by:  Marisue Humble, MD Page: [phone] 03/27/2022, 6:06 PM

## 2022-03-27 NOTE — Plan of Care (Signed)
?  Problem: Clinical Measurements: ?Goal: Will remain free from infection ?Outcome: Progressing ?  ?

## 2022-03-27 NOTE — Progress Notes (Signed)
PROGRESS NOTE  Tajah Schreiner  DOB: [birth year]  PCP: Aviva Kluver VFI:[id number]  DOA: 03/24/2022  LOS: 0 days  Hospital Day: 4  Brief narrative: Kosta Schnitzler is a 36 y.o. male with PMH significant for multiple sclerosis, diagnosed in 2022 not on any medicines Patient was brought to the ED after falling while walking outside.  Patient is originally from [place] and was diagnosed with multiple sclerosis in 2022 after developing lower extremity symptoms.  He was treated with IV steroids at that time and was discharged.  He has lost follow-up and was never started on disease modifying therapy.   He moved to [place] and then to [place] a month ago trying to find his brother.  But he found that his brother is no longer here.  Patient does not want to stay locally and plans to move back once he is able to.  Patient states his symptoms and weakness have not gotten worse since his diagnosis in 2022 but also mentions that he has been falling more frequent lately. On 6/10, he was noted to be falling while in a bus stop, bystanders called ambulance and was brought to the ED.  Admitted to hospitalist service Neurology consultation was obtained. See below for details  Subjective: Patient was seen and examined this morning.   Lying on bed.  Not in distress.  No new symptoms  We discussed about MRI findings yesterday and the plan to keep him on IV steroids.  Principal Problem:   Falls Active Problems:   Multiple sclerosis [facility])   Homeless    Assessment and plan: Worsening falls History of multiple sclerosis -He was admitted after his fifth visit in the ED for falls. -Neurology consultation was obtained.  MRI brain without contrast, MRI cervical spine without contrast and MR thoracic spine obtained. -Per neurology review note, MRI brain showed large hyperintense T2 weighted signal lesion within the left parietal periatrial white matter.  MRI thoracic spine with contrast showed short segment  focus of T2 hyperintense signal abnormality within the left aspect of the spinal cord at the T8-T9 level spondylosis and right central disc degenerative same level. -Neurosurgery recommended Solu-Medrol IV 1 g daily for 5 days (EOT 6/17)   Homeless Social worker consult  Goals of care   Code Status: Full Code    Mobility: PT/OT eval  Skin assessment:     Nutritional status:  Body mass index is 27.39 kg/m.          Diet:  Diet Order             Diet regular Room service appropriate? Yes; Fluid consistency: Thin  Diet effective now                   DVT prophylaxis:  enoxaparin (LOVENOX) injection 40 mg Start: 03/24/22 1800 SCDs Start: 03/24/22 1715   Antimicrobials:  Fluid: None Consultants: Neurology Family Communication: None at bedside  Status is: Observation  Continue in-hospital care because: On IV Solu-Medrol to complete on 6/17 Level of care: Med-Surg   Dispo: The patient is from: Homeless              Anticipated d/c is to: Pending course              Patient currently is not medically stable to d/c.   Difficult to place patient No     Infusions:   sodium chloride 10 mL/hr at 03/27/22 1012   methylPREDNISolone (SOLU-MEDROL) injection 1,000 mg (03/27/22 1014)  Scheduled Meds:  enoxaparin (LOVENOX) injection  40 mg Subcutaneous Q24H   vitamin B-12  1,000 mcg Oral Daily    PRN meds:    Antimicrobials: Anti-infectives (From admission, onward)    None       Objective: Vitals:   03/26/22 2059 03/27/22 0500  BP: 112/70 115/80  Pulse: 92 88  Resp: 16 18  Temp: 98.2 F (36.8 C) 97.6 F (36.4 C)  SpO2: 100% 100%    Intake/Output Summary (Last 24 hours) at 03/27/2022 1128 Last data filed at 03/27/2022 1000 Gross per 24 hour  Intake 840 ml  Output 800 ml  Net 40 ml    Filed Weights   03/24/22 1534  Weight: 102.1 kg   Weight change:  Body mass index is 27.39 kg/m.   Physical Exam: General exam: Young male.  Looks  older for his stated age Skin: No rashes, lesions or ulcers. HEENT: Atraumatic, normocephalic, no obvious bleeding Lungs: Clear to auscultation bilaterally CVS: Regular rate and rhythm, no murmur GI/Abd soft, nontender, nondistended, bowel sound present CNS: Alert, awake, oriented x3  psychiatry: Sad affect Extremities: No pedal edema, no calf tenderness  Data Review: I have personally reviewed the laboratory data and studies available.  F/u labs ordered Unresulted Labs (From admission, onward)     Start     Ordered   03/31/22 0500  Creatinine, serum  (enoxaparin (LOVENOX)    CrCl >/= 30 ml/min)  Weekly,   R     Comments: while on enoxaparin therapy    03/24/22 1714            Signed, Lorin Glass, MD [facility] 03/27/2022

## 2022-03-27 NOTE — Evaluation (Signed)
Physical Therapy Evaluation Patient Details Name: Shane Ramirez MRN: [id number] DOB: 30-Jul-1986 Today's Date: 03/27/2022  History of Present Illness  36 yo male admitted with falls, MS exacerbation.  Clinical Impression  On eval, pt was Min guard assist for mobility. He stood at EOB with RW. Unfortunately, unable to assess pt's gait/balance 2* his noncompliance with requests to don gown/disposable scrub pants. He adamantly refuses. He is easily irritated with questioning and gives rude responses. Will follow on a trial basis however we are limited by pt's noncompliance.       Recommendations for follow up therapy are one component of a multi-disciplinary discharge planning process, led by the attending physician.  Recommendations may be updated based on patient status, additional functional criteria and insurance authorization.  Follow Up Recommendations No PT follow up    Assistance Recommended at Discharge None  Patient can return home with the following  A little help with walking and/or transfers;A little help with bathing/dressing/bathroom;Help with stairs or ramp for entrance;Assist for transportation;Assistance with cooking/housework    Equipment Recommendations None recommended by PT  Recommendations for Other Services       Functional Status Assessment Patient has had a recent decline in their functional status and demonstrates the ability to make significant improvements in function in a reasonable and predictable amount of time.     Precautions / Restrictions Precautions Precautions: Fall Restrictions Weight Bearing Restrictions: No      Mobility  Bed Mobility Overal bed mobility: Needs Assistance Bed Mobility: Supine to Sit, Sit to Supine     Supine to sit: Supervision, HOB elevated Sit to supine: Supervision, HOB elevated   General bed mobility comments: Supv for safety, lines. Increased time.    Transfers Overall transfer level: Needs assistance Equipment  used: Rolling walker (2 wheels) Transfers: Sit to/from Stand Sit to Stand: From elevated surface, Min guard           General transfer comment: Min guard for safety. Multiple attempts required. Cues for safety. This therapist had to hold sheet/blanket up around pt's waist    Ambulation/Gait               General Gait Details: NT-pt refuses to put on gown/disposable pants.  Stairs            Wheelchair Mobility    Modified Rankin (Stroke Patients Only)       Balance Overall balance assessment: Needs assistance, History of Falls         Standing balance support: Bilateral upper extremity supported Standing balance-Leahy Scale: Poor                               Pertinent Vitals/Pain Pain Assessment Pain Assessment: No/denies pain    Home Living Family/patient expects to be discharged to:: Shelter/Homeless                        Prior Function Prior Level of Function : Independent/Modified Independent             Mobility Comments: uses RW for ambulation safety       Hand Dominance        Extremity/Trunk Assessment   Upper Extremity Assessment Upper Extremity Assessment: Defer to OT evaluation    Lower Extremity Assessment Lower Extremity Assessment: Generalized weakness    Cervical / Trunk Assessment Cervical / Trunk Assessment: Normal  Communication   Communication: No difficulties  Cognition  Arousal/Alertness: Awake/alert Behavior During Therapy: WFL for tasks assessed/performed Overall Cognitive Status: Within Functional Limits for tasks assessed                                 General Comments: Easily irritated. Refuses to don gown/disposable scrub pants despite requests        General Comments      Exercises     Assessment/Plan    PT Assessment Patient needs continued PT services  PT Problem List Decreased strength;Decreased mobility;Decreased activity tolerance;Decreased  balance;Decreased knowledge of use of DME;Decreased cognition;Decreased safety awareness       PT Treatment Interventions DME instruction;Therapeutic activities;Gait training;Patient/family education;Balance training;Functional mobility training;Therapeutic exercise    PT Goals (Current goals can be found in the Care Plan section)  Acute Rehab PT Goals Patient Stated Goal: none stated PT Goal Formulation: With patient Time For Goal Achievement: 04/10/22 Potential to Achieve Goals: Good    Frequency Min 2X/week     Co-evaluation               AM-PAC PT "6 Clicks" Mobility  Outcome Measure Help needed turning from your back to your side while in a flat bed without using bedrails?: None Help needed moving from lying on your back to sitting on the side of a flat bed without using bedrails?: None Help needed moving to and from a bed to a chair (including a wheelchair)?: A Little Help needed standing up from a chair using your arms (e.g., wheelchair or bedside chair)?: A Little Help needed to walk in hospital room?: A Lot Help needed climbing 3-5 steps with a railing? : A Lot 6 Click Score: 18    End of Session   Activity Tolerance: Patient tolerated treatment well Patient left: in bed;with call bell/phone within reach;with bed alarm set   PT Visit Diagnosis: Muscle weakness (generalized) (M62.81);History of falling (Z91.81);Difficulty in walking, not elsewhere classified (R26.2)    Time: 0981-1914 PT Time Calculation (min) (ACUTE ONLY): 18 min   Charges:   PT Evaluation $PT Eval Moderate Complexity: 1 Mod            Faye Ramsay, PT Acute Rehabilitation  Office: [phone] Pager: [phone]

## 2022-03-27 NOTE — Progress Notes (Signed)
This nurse went into patient room to give IV ativan prior to MRI. Pt was irritated that another MRI had been ordered. When this nurse went to give ativan, I looked and saw patient's IV access was on the floor. The patient confirmed that he had removed it. This nurse asked to restart and IV and the patient complied. This nurse was unsuccessful and asked another nurse from the unit to try. When the second nurse attempted to place IV the patient withdrew his consent for both the IV and the MRI.  MRI was notified and patient is now resting in bed without IV access. Pt continues to voice his preference to leave. Will continue to monitor patient.

## 2022-03-27 NOTE — Progress Notes (Signed)
OT Cancellation Note  Patient Details Name: Shane Ramirez MRN: [id number] DOB: 11/10/1985   Cancelled Treatment:    Reason Eval/Treat Not Completed: Other (comment) Patient was in bed with cover over himself with no clothing on. Patient was educated on therapist role in continuum of care with patient cursing under breath and giving short answers. Patient declined to put clothing on at this time. Session was stopped with patient in bed. OT to continue to follow and check back as schedule will allow when patient is more participatory.  Sharyn Blitz OTR/L, MS Acute Rehabilitation Department Office# [phone] Pager# [phone]  03/27/2022, 11:07 AM

## 2022-03-28 ENCOUNTER — Inpatient Hospital Stay (HOSPITAL_COMMUNITY): Payer: Self-pay

## 2022-03-28 LAB — HEMOGLOBIN A1C
Hgb A1c MFr Bld: 5.3 % (ref 4.8–5.6)
Mean Plasma Glucose: 105.41 mg/dL

## 2022-03-28 LAB — GLUCOSE, CAPILLARY: Glucose-Capillary: 172 mg/dL — ABNORMAL HIGH (ref 70–99)

## 2022-03-28 MED ORDER — GADOBUTROL 1 MMOL/ML IV SOLN
10.0000 mL | Freq: Once | INTRAVENOUS | Status: AC | PRN
Start: 2022-03-28 — End: 2022-03-28
  Administered 2022-03-28: 10 mL via INTRAVENOUS

## 2022-03-28 MED ORDER — LORAZEPAM 2 MG/ML IJ SOLN
2.0000 mg | Freq: Once | INTRAMUSCULAR | Status: AC | PRN
  Administered 2022-03-28: 2 mg via INTRAVENOUS
  Filled 2022-03-28: qty 1

## 2022-03-28 MED ORDER — INSULIN ASPART 100 UNIT/ML IJ SOLN
0.0000 [IU] | Freq: Three times a day (TID) | INTRAMUSCULAR | Status: DC
  Administered 2022-03-29: 2 [IU] via SUBCUTANEOUS

## 2022-03-28 NOTE — Progress Notes (Signed)
  Progress Note Patient: Shane Ramirez LSL:[id number] DOB: October 23, 1985 DOA: 03/24/2022  DOS: the patient was seen and examined on 03/28/2022  Brief hospital course: Past medical history significant for MS not on any medication.  Originally from [place] diagnosed with MS in 2022.  Never started on disease modifying therapy.  Moved to [place] and now to [place] trying to find his brother. Assessment and Plan: Recurrent fall. Multiple sclerosis flareup. Not on any disease modifying therapy. Appears to have his fifth visit in the ER for fall. Neurology was consulted. MRI brain and MRI C-spine without contrast as well as T-spine shows T2 weighted hyperintense imaging concerning for possible flareup. Patient started on IV Solu-Medrol 1 g daily. Last dose of therapy on 6/17. Patient currently reports some improvement in his symptoms although does not appear to have any significant focal deficit at the time of my evaluation.  Homeless. Social worker consulted.  Subjective: No nausea no vomiting no fever no chills.  Continues to report dizziness and lightheadedness.  No falls in the hospital.  Physical Exam: Vitals:   03/27/22 1406 03/27/22 2256 03/28/22 0556 03/28/22 1314  BP: 124/70 118/76 (!) 113/53 120/66  Pulse: 87 88 79 86  Resp: 14 15  14   Temp: 98.6 F (37 C) 98.1 F (36.7 C) 97.8 F (36.6 C) 98.6 F (37 C)  TempSrc: Oral Oral Oral Oral  SpO2: 97% 98% 98% 99%  Weight:      Height:       General: Appear in mild distress; no visible Abnormal Neck Mass Or lumps, Conjunctiva normal Cardiovascular: S1 and S2 Present, no Murmur, Respiratory: good respiratory effort, Bilateral Air entry present and CTA, no Crackles, no wheezes Abdomen: Bowel Sound present, Non tender  Extremities: no Pedal edema Neurology: alert and oriented to time, place, and person, no pronator drift, no intraocular ophthalmoplegia, adequate strength bilaterally. Gait not checked due to patient safety  concerns   Data Reviewed: I have Reviewed nursing notes, Vitals, and Lab results since pt's last encounter. Pertinent lab results CBC and BMP I have ordered test including CBC and BMP    Family Communication: None at bedside  Disposition: Status is: Inpatient Remains inpatient appropriate because: On IV steroids for 3 more days. Author: , MD 03/28/2022 7:43 PM  Please look on [email or web address] to find out who is on call.

## 2022-03-28 NOTE — Progress Notes (Signed)
IV team consult for new IV. Patient would only allow an IV in his [facility]. I offered other areas that would not be in a bend, he refused. Pt. Would only allow me to put one piece of tape on inserted IV. RN notified.

## 2022-03-28 NOTE — Hospital Course (Signed)
Past medical history significant for MS not on any medication.  Originally from [place] diagnosed with MS in 2022.  Never started on disease modifying therapy.  Moved to [place] and now to [place] trying to find his brother.

## 2022-03-28 NOTE — Progress Notes (Signed)
Subjective/interval events -Reports no improvement -Denies any significant steroid side effects -Notes that his stool is of a consistency that gets stuck to him  Current vital signs: BP 120/66 (BP Location: Right Arm)   Pulse 86   Temp 98.6 F (37 C) (Oral)   Resp 14   Ht 6\' 4"  (1.93 m)   Wt 102.1 kg   SpO2 99%   BMI 27.39 kg/m  Vital signs in last 24 hours: Temp:  [97.8 F (36.6 C)-98.6 F (37 C)] 98.6 F (37 C) (06/14 1314) Pulse Rate:  [79-88] 86 (06/14 1314) Resp:  [14-15] 14 (06/14 1314) BP: (113-120)/(53-76) 120/66 (06/14 1314) SpO2:  [98 %-99 %] 99 % (06/14 1314)  Examination:  General: No acute distress, flat affect, breathing comfortably, perfusing extremities well  Mental status: Awake, alert, appropriately conversant, oriented to self place and situation Cranial nerves: Pupils equal round reactive, EOMI, face symmetric, tongue midline, sensation intact bilaterally on the face Coordination: Finger-to-nose intact bilaterally.  Heel-to-shin limited by hip flexor weakness  No new labs   Repeat MRI brain and C-spine personally reviewed, radiology report pending but on my review they remain quite motion limited without clear new contrast-enhancing lesion  Assessment: Multiple sclerosis flare versus decompensation in the setting of stressors  Plan: - LDSSI with meals added while patient is on pulse dose steroids, as there is substantial risk of prandial hyperglycemia - Continue steroids, patient appears to be tolerating them well although he is not benefiting yet, he may with more time - May consider addition of Ampyra, however this is unlikely to be a durable solution given his social situation.  I defer to primary team whether social work or pharmacy may be able to assist with procurement of this medication for an outpatient supply  June MD-PhD [facility] [phone] Available 7 AM to 7 PM, outside these hours please contact Neurologist on  call listed on AMION

## 2022-03-28 NOTE — TOC Initial Note (Signed)
Transition of Care [facility]) - Initial/Assessment Note    Patient Details  Name: Shane Ramirez MRN: [id number] Date of Birth: 1985-12-13  Transition of Care [facility]) CM/SW Contact:    Lennart Pall, LCSW Phone Number: 03/28/2022, 2:34 PM  Clinical Narrative:                 Met with pt today to discuss dc planning needs.  Pt seems irritable as questions asked to determine his plans.  Pt is frustrated with shelter options in this area.  Explained that I can provide him with shelter resource info again and he states, "I'll just go and find a nice piece of ground to sleep on."  Pt had stated plans earlier of possibly leaving this area, however, now says he will stay because no way to afford bus pass to his home in [place].  Will attempt to set him up with one of our local health clinics.  Per MD, should be here until 6/17 to complete solumedrol treatments.  Continue to follow.  Expected Discharge Plan: Homeless Shelter Barriers to Discharge: Continued Medical Work up, Inadequate or no insurance   Patient Goals and CMS Choice        Expected Discharge Plan and Services Expected Discharge Plan: Homeless Shelter In-house Referral: Clinical Social Work Discharge Planning Services: [facility], Medication Assistance, [facility]   Living arrangements for the past 2 months: Homeless                                      Prior Living Arrangements/Services Living arrangements for the past 2 months: Homeless   Patient language and need for interpreter reviewed:: Yes Do you feel safe going back to the place where you live?: Yes      Need for Family Participation in Patient Care: Yes (Comment) Care giver support system in place?: No (comment)   Criminal Activity/Legal Involvement Pertinent to Current Situation/Hospitalization: No - Comment as needed  Activities of Daily Living Home Assistive Devices/Equipment: Walker (specify type) ADL Screening (condition at time of  admission) Patient's cognitive ability adequate to safely complete daily activities?: Yes Is the patient deaf or have difficulty hearing?: No Does the patient have difficulty seeing, even when wearing glasses/contacts?: Yes Does the patient have difficulty concentrating, remembering, or making decisions?: No Patient able to express need for assistance with ADLs?: Yes Does the patient have difficulty dressing or bathing?: No Independently performs ADLs?: Yes (appropriate for developmental age) Does the patient have difficulty walking or climbing stairs?: Yes Weakness of Legs: Both Weakness of Arms/Hands: None  Permission Sought/Granted                  Emotional Assessment Appearance:: Appears stated age Attitude/Demeanor/Rapport: Guarded, Complaining Affect (typically observed): Irritable Orientation: : Oriented to Self, Oriented to Place, Oriented to  Time, Oriented to Situation Alcohol / Substance Use: Not Applicable Psych Involvement: No (comment)  Admission diagnosis:  Falls [W46.XXXA] Multiple falls [R29.6] Rib pain on left side [R07.81] Fall, initial encounter [W19.XXXA] Multiple sclerosis ([place]) [G35] Patient Active Problem List   Diagnosis Date Noted   Falls 03/24/2022   Multiple sclerosis ([place]) 03/24/2022   Homeless 03/24/2022   PCP:  Merryl Hacker, No Pharmacy:   [facility] [place] [address] Phone: [phone] Fax: [phone]     Social Determinants of Health (SDOH) Interventions    Readmission Risk Interventions  No data to display

## 2022-03-28 NOTE — Evaluation (Signed)
Occupational Therapy Evaluation Patient Details Name: Shane Ramirez MRN: [id number] DOB: 1986/08/25 Today's Date: 03/28/2022   History of Present Illness 36 yo male admitted with falls, MS exacerbation.   Clinical Impression   Mr. Koda Quier is a 36 year old man who presents with MS exerbation - reporting increased weakness in lower extremities and falls. On evaluation patient presents with functional upper body strength, decreased strength in lower extremities and impaired balance. Evaluation interrupted by patient needing to be taken to MRI for imaging. Patient demonstrated ability to transfer in and out of be without physical assistance and standing and taking steps toward bathroom with RW with short shuffling gait - and then having to return to bed for MRI. Patient and nursing staff reports he is able to ambulate to bathroom and doesn't allow nursing staff to assist him with ADLs. His appearance is very unkempt as well as his room. Patient's affect is very flat and he appears irritated with hospital stay and therapist though he was willing to demonstrate his physical abilities when asked.  Will follow patient acutely to make sure he maintains his physical abilities in order to return to shelter - if he is willing to continue working with therapy. He wants to be allowed to get up on his own. Patient is homeless and uninsured so discharge planning is challenging. Do not expect patient to accept any therapy services once discharged even if it could be setup.      Recommendations for follow up therapy are one component of a multi-disciplinary discharge planning process, led by the attending physician.  Recommendations may be updated based on patient status, additional functional criteria and insurance authorization.   Follow Up Recommendations  No OT follow up    Assistance Recommended at Discharge None  Patient can return home with the following Assist for transportation;Help with stairs or  ramp for entrance    Functional Status Assessment  Patient has had a recent decline in their functional status and demonstrates the ability to make significant improvements in function in a reasonable and predictable amount of time.  Equipment Recommendations  None recommended by OT    Recommendations for Other Services       Precautions / Restrictions Precautions Precautions: Fall Restrictions Weight Bearing Restrictions: No      Mobility Bed Mobility Overal bed mobility: Needs Assistance Bed Mobility: Supine to Sit, Sit to Supine     Supine to sit: Supervision Sit to supine: Supervision   General bed mobility comments: supervision and increased time due to LE weakness.    Transfers Overall transfer level: Needs assistance Equipment used: Rolling walker (2 wheels) Transfers: Sit to/from Stand Sit to Stand: Supervision           General transfer comment: Supervision to stand and take steps towards bathroom. Ambulation and further activity limited due to presents of techs to  take patient to MRI.      Balance Overall balance assessment: History of Falls Sitting-balance support: No upper extremity supported, Feet supported Sitting balance-Leahy Scale: Fair     Standing balance support: During functional activity Standing balance-Leahy Scale: Poor                             ADL either performed or assessed with clinical judgement   ADL Overall ADL's : Needs assistance/impaired Eating/Feeding: Independent   Grooming: Independent   Upper Body Bathing: Supervision/ safety   Lower Body Bathing: Supervison/ safety  Upper Body Dressing : Supervision/safety   Lower Body Dressing: Supervision/safety   Toilet Transfer: Supervision/safety;Rolling walker (2 wheels);Regular Toilet   Toileting- Clothing Manipulation and Hygiene: Supervision/safety       Functional mobility during ADLs: Supervision/safety;Rolling walker (2 wheels)        Vision   Vision Assessment?: No apparent visual deficits     Perception     Praxis      Pertinent Vitals/Pain Pain Assessment Pain Assessment: No/denies pain     Hand Dominance     Extremity/Trunk Assessment Upper Extremity Assessment Upper Extremity Assessment: Overall WFL for tasks assessed   Lower Extremity Assessment Lower Extremity Assessment: Defer to PT evaluation   Cervical / Trunk Assessment Cervical / Trunk Assessment: Normal   Communication Communication Communication: No difficulties   Cognition Arousal/Alertness: Awake/alert Behavior During Therapy: Flat affect Overall Cognitive Status: Within Functional Limits for tasks assessed                                 General Comments: Very flat affect. Answers questions with minimal verbalizations. Appears irritated by therapist and hospital setting.     General Comments       Exercises     Shoulder Instructions      Home Living Family/patient expects to be discharged to:: Shelter/Homeless                                        Prior Functioning/Environment Prior Level of Function : Independent/Modified Independent             Mobility Comments: uses RW for ambulation safety ADLs Comments: independent        OT Problem List: Decreased strength;Decreased activity tolerance;Impaired balance (sitting and/or standing);Decreased knowledge of use of DME or AE      OT Treatment/Interventions: Self-care/ADL training;Therapeutic exercise;DME and/or AE instruction;Therapeutic activities;Balance training;Patient/family education    OT Goals(Current goals can be found in the care plan section) Acute Rehab OT Goals Patient Stated Goal: to go to bathroom on his own OT Goal Formulation: With patient Time For Goal Achievement: 04/11/22 Potential to Achieve Goals: Good  OT Frequency: Min 2X/week    Co-evaluation              AM-PAC OT "6 Clicks" Daily  Activity     Outcome Measure Help from another person eating meals?: None Help from another person taking care of personal grooming?: A Little Help from another person toileting, which includes using toliet, bedpan, or urinal?: A Little Help from another person bathing (including washing, rinsing, drying)?: A Little Help from another person to put on and taking off regular upper body clothing?: A Little Help from another person to put on and taking off regular lower body clothing?: A Little 6 Click Score: 19   End of Session Equipment Utilized During Treatment: Rolling walker (2 wheels) Nurse Communication: Mobility status  Activity Tolerance: Patient tolerated treatment well Patient left: in bed;with call bell/phone within reach  OT Visit Diagnosis: Muscle weakness (generalized) (M62.81)                Time: 1342-1350 OT Time Calculation (min): 8 min Charges:  OT General Charges $OT Visit: 1 Visit OT Evaluation $OT Eval Low Complexity: 1 Low  Jayliana Valencia, OTR/L [place]  Office [phone] Pager: [place]  03/28/2022, 2:26 PM

## 2022-03-29 ENCOUNTER — Inpatient Hospital Stay (HOSPITAL_COMMUNITY)
Admission: EM | Admit: 2022-03-29 | Discharge: 2022-03-31 | DRG: 060 | Disposition: A | Discharge status: Home / Self Care | Payer: Self-pay | Attending: Internal Medicine | Admitting: Internal Medicine

## 2022-03-29 ENCOUNTER — Encounter (HOSPITAL_COMMUNITY): Payer: Self-pay

## 2022-03-29 ENCOUNTER — Other Ambulatory Visit: Payer: Self-pay

## 2022-03-29 ENCOUNTER — Observation Stay (HOSPITAL_COMMUNITY)
Admission: EM | Admit: 2022-03-29 | Discharge: 2022-03-29 | Discharge status: Against Medical Advice | Payer: Self-pay | Attending: Internal Medicine | Admitting: Internal Medicine

## 2022-03-29 DIAGNOSIS — R296 Repeated falls: Secondary | ICD-10-CM

## 2022-03-29 DIAGNOSIS — G35 Multiple sclerosis: Secondary | ICD-10-CM | POA: Diagnosis present

## 2022-03-29 DIAGNOSIS — R2681 Unsteadiness on feet: Principal | ICD-10-CM | POA: Insufficient documentation

## 2022-03-29 DIAGNOSIS — F32A Depression, unspecified: Secondary | ICD-10-CM | POA: Diagnosis present

## 2022-03-29 DIAGNOSIS — Z5900 Homelessness unspecified: Secondary | ICD-10-CM

## 2022-03-29 DIAGNOSIS — R197 Diarrhea, unspecified: Secondary | ICD-10-CM | POA: Diagnosis present

## 2022-03-29 DIAGNOSIS — Z91199 Patient's noncompliance with other medical treatment and regimen due to unspecified reason: Secondary | ICD-10-CM

## 2022-03-29 DIAGNOSIS — M79606 Pain in leg, unspecified: Secondary | ICD-10-CM

## 2022-03-29 LAB — BASIC METABOLIC PANEL WITH GFR
Anion gap: 7 (ref 5–15)
BUN: 25 mg/dL — ABNORMAL HIGH (ref 6–20)
CO2: 26 mmol/L (ref 22–32)
Calcium: 9.6 mg/dL (ref 8.9–10.3)
Chloride: 109 mmol/L (ref 98–111)
Creatinine, Ser: 0.8 mg/dL (ref 0.61–1.24)
GFR, Estimated: >60 mL/min
Glucose, Bld: 135 mg/dL — ABNORMAL HIGH (ref 70–99)
Potassium: 3.9 mmol/L (ref 3.5–5.1)
Sodium: 142 mmol/L (ref 135–145)

## 2022-03-29 LAB — CBC
HCT: 45.4 % (ref 39.0–52.0)
Hemoglobin: 15.2 g/dL (ref 13.0–17.0)
MCH: 29.6 pg (ref 26.0–34.0)
MCHC: 33.5 g/dL (ref 30.0–36.0)
MCV: 88.3 fL (ref 80.0–100.0)
Platelets: 244 10*3/uL (ref 150–400)
RBC: 5.14 MIL/uL (ref 4.22–5.81)
RDW: 14.1 % (ref 11.5–15.5)
WBC: 22.1 10*3/uL — ABNORMAL HIGH (ref 4.0–10.5)
nRBC: 0 % (ref 0.0–0.2)

## 2022-03-29 LAB — GLUCOSE, CAPILLARY
Glucose-Capillary: 126 mg/dL — ABNORMAL HIGH (ref 70–99)
Glucose-Capillary: 172 mg/dL — ABNORMAL HIGH (ref 70–99)

## 2022-03-29 MED ORDER — ENOXAPARIN SODIUM 40 MG/0.4ML IJ SOSY: 40.0000 mg | PREFILLED_SYRINGE | INTRAMUSCULAR | Status: DC

## 2022-03-29 MED ORDER — LOPERAMIDE HCL 2 MG PO CAPS: 2.0000 mg | ORAL_CAPSULE | ORAL | Status: DC | PRN

## 2022-03-29 MED ORDER — LOPERAMIDE HCL 2 MG PO CAPS
2.0000 mg | ORAL_CAPSULE | ORAL | Status: DC | PRN
  Administered 2022-03-29: 2 mg via ORAL
  Filled 2022-03-29: qty 1

## 2022-03-29 MED ORDER — SODIUM CHLORIDE 0.9 % IV SOLN
1000.0000 mg | INTRAVENOUS | Status: DC
  Filled 2022-03-29 (×2): qty 16

## 2022-03-29 MED ORDER — SODIUM CHLORIDE 0.9 % IV SOLN
1000.0000 mg | INTRAVENOUS | Status: DC
  Administered 2022-03-29: 1000 mg via INTRAVENOUS
  Filled 2022-03-29: qty 16

## 2022-03-29 MED ORDER — ONDANSETRON HCL 4 MG/2ML IJ SOLN: 4.0000 mg | Freq: Four times a day (QID) | INTRAMUSCULAR | Status: DC | PRN

## 2022-03-29 MED ORDER — ACETAMINOPHEN 650 MG RE SUPP: 650.0000 mg | Freq: Four times a day (QID) | RECTAL | Status: DC | PRN

## 2022-03-29 MED ORDER — ACETAMINOPHEN 325 MG PO TABS: 650.0000 mg | ORAL_TABLET | Freq: Four times a day (QID) | ORAL | Status: DC | PRN

## 2022-03-29 MED ORDER — ONDANSETRON HCL 4 MG PO TABS: 4.0000 mg | ORAL_TABLET | Freq: Four times a day (QID) | ORAL | Status: DC | PRN

## 2022-03-29 MED ORDER — SODIUM CHLORIDE 0.9 % IV SOLN
1000.0000 mg | Freq: Once | INTRAVENOUS | Status: DC
  Filled 2022-03-29: qty 16

## 2022-03-29 NOTE — ED Notes (Signed)
Pt walked out of triage room 5 demanding to leave again. Pt walked out to lobby understanding he is leaving AMA.

## 2022-03-29 NOTE — Progress Notes (Signed)
24 hour chart audit completed 

## 2022-03-29 NOTE — ED Notes (Signed)
Writer attempted to triage the patient and he became angry stating he did not want to be here and did not want to answer questions.

## 2022-03-29 NOTE — Discharge Summary (Signed)
[facility] Discharge Summary   Patient: Shane Ramirez CVE:[id number]   PCP: Pcp, No DOB: September 06, 1986   Date of admission: 03/24/2022   Date of discharge: 03/29/2022    Discharge Diagnoses:  Principal Problem:   Multiple sclerosis (HCC) Active Problems:   Recurrent falls   Diarrhea   Homeless  Discharge Condition: patient left AMA  History of present illness: As per the H and P dictated on admission, "Shane Ramirez is a 36 y.o. male with medical history significant of MS diagnosed in 2022, not on any medication, reported has been following for several month, is from [place], came to [place] a month ago now homeless, bystanders called ambulance due to seeing him falling on the street, he is brought to the ED, this is the fifth ED visit in the past week due to falls   Report quit drinking several years ago, does not smoke, does not use drugs.   He denies pain, no fever."  Hospital Course:  Past medical history significant for MS not on any medication.  Originally from [place] diagnosed with MS in 2022.  Never started on disease modifying therapy.  Moved to [place] and now to [place] trying to find his brother.   Summary of his active problems in the hospital is as following. Multiple sclerosis Recurrent fall. Not on any disease modifying therapy. Appears to have his fifth visit in the ER for fall. Neurology was consulted. MRI brain and MRI C-spine without contrast as well as T-spine shows T2 weighted hyperintense imaging concerning for possible flareup. Patient started on IV Solu-Medrol 1 g daily. Last dose of therapy would be on 6/17. Patient currently reports some improvement in his symptoms although does not appear to have any significant focal deficit at the time of my evaluation.   Homeless. Social worker consulted.  Diarrhea Patient reports chronic diarrhea. Treat with Imodium.  No abdominal pain or nausea or vomiting.  Leukocytosis is mostly associated  with steroids.  patient left AMA  Procedures and Results: None  Consultations: Neurology  The results of significant diagnostics from this hospitalization (including imaging, microbiology, ancillary and laboratory) are listed below for reference.    Significant Diagnostic Studies: MR CERVICAL SPINE W WO CONTRAST  Result Date: 03/28/2022 CLINICAL DATA:  Follow-up examination for multiple sclerosis, myelopathy. EXAM: MRI CERVICAL SPINE WITHOUT AND WITH CONTRAST TECHNIQUE: Multiplanar and multiecho pulse sequences of the cervical spine, to include the craniocervical junction and cervicothoracic junction, were obtained without and with intravenous contrast. CONTRAST:  81mL GADAVIST GADOBUTROL 1 MMOL/ML IV SOLN COMPARISON:  Prior study from 03/25/2022. FINDINGS: Alignment: Examination is moderately to severely degraded by motion artifact, limiting assessment. Vertebral bodies normally aligned with preservation of the normal cervical lordosis. No listhesis. Vertebrae: Vertebral body height maintained without acute or chronic fracture. Bone marrow signal intensity within normal limits. No discrete or worrisome osseous lesions. No abnormal marrow edema or enhancement. Cord: Evaluation of the cervical spinal cord limited by motion. No obvious cord signal changes to suggest demyelinating disease. No abnormal enhancement. Posterior Fossa, vertebral arteries, paraspinal tissues: Paraspinous soft tissues grossly within normal limits. Disc levels: No more than mild multilevel noncompressive disc bulging noted within the cervical spine. No significant spinal stenosis. Evaluation for foraminal encroachment limited by motion. IMPRESSION: 1. Technically limited exam due to extensive motion artifact. No obvious cord signal changes to suggest demyelinating disease. No abnormal enhancement. 2. Mild multilevel cervical spondylosis without significant stenosis. Electronically Signed   By: Rise Mu M.D.   On:  03/28/2022  20:05   MR BRAIN W WO CONTRAST  Result Date: 03/28/2022 CLINICAL DATA:  Follow-up examination for multiple sclerosis, lower extremity weakness. EXAM: MRI HEAD WITHOUT AND WITH CONTRAST TECHNIQUE: Multiplanar, multiecho pulse sequences of the brain and surrounding structures were obtained without and with intravenous contrast. CONTRAST:  41mL GADAVIST GADOBUTROL 1 MMOL/ML IV SOLN COMPARISON:  Prior brain MRI from 03/25/2022. FINDINGS: Brain: Current examination has been performed with a mildly improved degree of motion as compared to previous, however, examination remains fairly limited and motion degraded. Mildly advanced cerebral atrophy for age. Patchy and confluent T2/FLAIR hyperintensities involving the periventricular, deep, and juxta cortical white matter of both cerebral hemispheres, consistent with history of multiple sclerosis. The largest lesion again noted at the posterior left periventricular white matter and measures up to approximately 2.2 cm. Associated incomplete ring of diffusion signal abnormality about this lesion is seen (series 6, image 79), similar to previous. Probable associated subtle ADC correlate noted as well (series 7, image 29). Although no clear associated enhancement seen on the motion degraded postcontrast sequences, overall appearance of the diffusion signal change suspected to reflect a degree of associated active demyelination. No other clear enhancing lesions or evidence for active demyelination elsewhere within the brain. No evidence for acute or subacute infarct. No areas of chronic cortical infarction. No acute or chronic intracranial blood products. No mass lesion, mass effect or midline shift. No hydrocephalus or extra-axial fluid collection. Pituitary gland suprasellar region normal. Midline structures intact and normally formed. No other appreciable abnormal enhancement. Vascular: Major intracranial vascular flow voids are maintained at the skull base. Skull  and upper cervical spine: Craniocervical junction within normal limits. Bone marrow signal intensity grossly normal without focal marrow replacing lesion. No scalp soft tissue abnormality. Sinuses/Orbits: Globes and orbital soft tissues within normal limits. Mild scattered mucosal thickening noted about the ethmoidal air cells and maxillary sinuses. No mastoid effusion. Other: None. IMPRESSION: 1. Technically limited exam due to extension motion artifact. 2. Patchy and confluent T2/FLAIR hyperintensities involving the supratentorial cerebral white matter, consistent with provided history of multiple sclerosis. Associated incomplete ring of diffusion signal abnormality about a dominant lesion at the left parietal periventricular white matter suspected to reflect a degree of active demyelination. No other definite or convincing evidence for active demyelination elsewhere on this motion degraded exam. Electronically Signed   By: Rise Mu M.D.   On: 03/28/2022 19:45   MR THORACIC SPINE W WO CONTRAST  Result Date: 03/26/2022 CLINICAL DATA:  Provided history: Multiple sclerosis. Additional history provided: Patient diagnosed with multiple sclerosis in 2022, several falls. EXAM: MRI THORACIC WITHOUT AND WITH CONTRAST TECHNIQUE: Multiplanar and multiecho pulse sequences of the thoracic spine were obtained without and with intravenous contrast. CONTRAST:  27mL GADAVIST GADOBUTROL 1 MMOL/ML IV SOLN COMPARISON:  MRI brain 03/25/2022. MRI cervical spine 03/25/2022. FINDINGS: The examination is significantly motion degraded, limiting evaluation. Most notably, there is moderate motion degradation of the sagittal T2 TSE sequence, moderate motion degradation of the sagittal STIR sequence, severe motion degradation of the axial T2 TSE sequence, severe motion degradation of the axial T2 GRE sequence and moderate motion degradation of the sagittal T1 weighted post-contrast fat saturated sequence. Alignment:  No  significant spondylolisthesis. Vertebrae: Mild chronic anterior wedge deformity of the T12 vertebral body. Vertebral body height is otherwise maintained. Multilevel degenerative endplate irregularity. No significant marrow edema or focal suspicious osseous lesion. Cord: Suspected short-segment focus of T2 hyperintense signal abnormality within the left aspect of the spinal cord at  the T8-T9 level (for instance as seen on series 18, image 10) (series 20, image 25). No definite signal abnormality identified elsewhere within the thoracic spinal cord, although motion degradation significantly limits evaluation. No appreciable pathologic spinal cord enhancement. Paraspinal and other soft tissues: Nonspecific 16 mm peripherally enhancing, cystic, cutaneous/subcutaneous lesion within the midline back at the T5 level (series 24, image 10) (series 18, image 9). Disc levels: Multilevel disc degeneration. Disc degeneration is greatest at T4-T5 and T5-T6 (moderate at these levels). Multilevel disc bulges. At T8-T9, a small right center disc protrusion focally effaces the ventral thecal sac, contacting and minimally flattening the right ventral aspect of the spinal cord. However, the dorsal CSF space is maintained within the spinal canal. No significant spinal canal stenosis at the remaining levels. No significant foraminal stenosis. IMPRESSION: Significantly motion degraded examination, as described and limiting evaluation. Suspected short-segment focus of T2 hyperintense signal abnormality within the left aspect of the spinal cord at the T8-T9 level, likely reflecting a demyelinating lesion given the provided history. No lesions are identified elsewhere within the thoracic spinal cord, although motion degradation significantly limits evaluation. No appreciable pathologic spinal cord enhancement. Thoracic spondylosis, as described. Most notably at T8-T9, a small right center disc protrusion focally effaces the ventral thecal  sac, contacting and minimally flattening the ventral aspect of the spinal cord. However, the dorsal CSF space is maintained within the spinal canal. Mild chronic T12 anterior wedge vertebral compression deformity. Nonspecific 16 mm cystic-appearing cutaneous/subcutaneous within the midline back at the T5 level. Direct visualization is recommended. Electronically Signed   By: Jackey Loge D.O.   On: 03/26/2022 15:29   MR BRAIN WO CONTRAST  Result Date: 03/25/2022 CLINICAL DATA:  Multiple sclerosis EXAM: MRI HEAD WITHOUT CONTRAST MRI CERVICAL SPINE WITHOUT CONTRAST TECHNIQUE: Multiplanar, multiecho pulse sequences of the brain and surrounding structures, and cervical spine, to include the craniocervical junction and cervicothoracic junction, were obtained without intravenous contrast. COMPARISON:  None Available. FINDINGS: Examination is severely degraded by motion. The was terminated prior to completion due to patient inability to cooperate with the technologist's instructions. MRI HEAD FINDINGS There is a large hyperintense T2-weighted signal lesion within the left parietal periatrial white matter. Otherwise, imaging quality is too degraded to give much information. MRI CERVICAL SPINE FINDINGS There is no spinal canal stenosis. Cord parenchyma cannot be assessed due to motion. IMPRESSION: 1. Severely motion degraded and truncated examination. 2. Large hyperintense T2-weighted signal lesion within the left parietal periatrial white matter. This may indicate a demyelinating lesion or other inflammatory process. 3. No spinal canal stenosis. Cord parenchyma cannot be assessed due to motion. Electronically Signed   By: Deatra Robinson M.D.   On: 03/25/2022 20:26   MR CERVICAL SPINE WO CONTRAST  Result Date: 03/25/2022 CLINICAL DATA:  Multiple sclerosis EXAM: MRI HEAD WITHOUT CONTRAST MRI CERVICAL SPINE WITHOUT CONTRAST TECHNIQUE: Multiplanar, multiecho pulse sequences of the brain and surrounding structures, and  cervical spine, to include the craniocervical junction and cervicothoracic junction, were obtained without intravenous contrast. COMPARISON:  None Available. FINDINGS: Examination is severely degraded by motion. The was terminated prior to completion due to patient inability to cooperate with the technologist's instructions. MRI HEAD FINDINGS There is a large hyperintense T2-weighted signal lesion within the left parietal periatrial white matter. Otherwise, imaging quality is too degraded to give much information. MRI CERVICAL SPINE FINDINGS There is no spinal canal stenosis. Cord parenchyma cannot be assessed due to motion. IMPRESSION: 1. Severely motion degraded and truncated examination. 2.  Large hyperintense T2-weighted signal lesion within the left parietal periatrial white matter. This may indicate a demyelinating lesion or other inflammatory process. 3. No spinal canal stenosis. Cord parenchyma cannot be assessed due to motion. Electronically Signed   By: Deatra Robinson M.D.   On: 03/25/2022 20:26   DG Ribs Unilateral W/Chest Left  Result Date: 03/24/2022 CLINICAL DATA:  Fall.  Left chest wall pain. EXAM: LEFT RIBS AND CHEST - 3+ VIEW COMPARISON:  None Available. FINDINGS: The lungs are clear without focal pneumonia, edema, pneumothorax or pleural effusion. The cardiopericardial silhouette is within normal limits for size. Oblique views of the left ribs show no evidence for an acute displaced left-sided rib fracture IMPRESSION: Negative. Electronically Signed   By: Kennith Center M.D.   On: 03/24/2022 10:46    Microbiology: No results found for this or any previous visit (from the past 240 hour(s)).   Labs: CBC: Recent Labs  Lab 03/24/22 1032 03/25/22 0521 03/29/22 0451  WBC 7.2 5.5 22.1*  NEUTROABS 4.4  --   --   HGB 14.0 12.7* 15.2  HCT 41.8 38.1* 45.4  MCV 88.4 89.0 88.3  PLT 174 161 244   Basic Metabolic Panel: Recent Labs  Lab 03/24/22 1032 03/25/22 0521 03/29/22 0451  NA 143  142 142  K 3.9 3.9 3.9  CL 112* 110 109  CO2 27 25 26   GLUCOSE 97 91 135*  BUN 8 17 25*  CREATININE 0.76 0.79 0.80  CALCIUM 9.2 8.6* 9.6  MG  --  2.0  --    Liver Function Tests: Recent Labs  Lab 03/24/22 1032  AST 22  ALT 26  ALKPHOS 68  BILITOT 1.1  PROT 6.6  ALBUMIN 3.8   Recent Labs  Lab 03/24/22 1032  LIPASE 30   No results for input(s): "AMMONIA" in the last 168 hours. Cardiac Enzymes: Recent Labs  Lab 03/25/22 0521  CKTOTAL 33*   BNP (last 3 results) No results for input(s): "BNP" in the last 8760 hours. CBG: Recent Labs  Lab 03/28/22 2247 03/29/22 0752 03/29/22 1206  GLUCAP 172* 126* 172*   Time spent: 30 minutes  Signed:  03/31/22  [facility] 03/29/2022, 6:26 PM

## 2022-03-29 NOTE — ED Provider Notes (Signed)
[facility]-EMERGENCY DEPT Provider Note   CSN: [id number] Arrival date & time: 03/29/22  1407     History  No chief complaint on file.   Winifred Balogh is a 36 y.o. male.  36 year old male who has history of MS presents to the emergency room for evaluation.  Patient was just admitted and was undergoing IV steroids for MS flareup.  Patient decided to leave AMA today.  Patient states he was brought back into the emergency room by one of the hospital staff due to concern for unsteadiness.  Patient prior to being admitted a few days ago had several visits to the emergency room given recurrent falls.  Patient reports persistent symptoms.  States he still feels unsteady.  Does have a walker in the room with him which he has had for over a year.  Patient recently moved from [place].  Currently he is homeless.  Patient states he left AMA today because he was not seen in the improvement and he was not exactly sure if he was getting any help.  He does voice that he was receiving IV steroids.  When asked if patient wants to be treated today he states yes.  Does have capacity to make decision if he would like to leave AMA.  I did make patient aware of this.  Despite being aware of this patient states that he is in agreement to stay for continued treatment.  The history is provided by the patient. No language interpreter was used.       Home Medications Prior to Admission medications   Not on File      Allergies    Patient has no known allergies.    Review of Systems   Review of Systems  Physical Exam Updated Vital Signs There were no vitals taken for this visit. Physical Exam  ED Results / Procedures / Treatments   Labs (all labs ordered are listed, but only abnormal results are displayed) Labs Reviewed - No data to display  EKG None  Radiology MR CERVICAL SPINE W WO CONTRAST  Result Date: 03/28/2022 CLINICAL DATA:  Follow-up examination for multiple  sclerosis, myelopathy. EXAM: MRI CERVICAL SPINE WITHOUT AND WITH CONTRAST TECHNIQUE: Multiplanar and multiecho pulse sequences of the cervical spine, to include the craniocervical junction and cervicothoracic junction, were obtained without and with intravenous contrast. CONTRAST:  29mL GADAVIST GADOBUTROL 1 MMOL/ML IV SOLN COMPARISON:  Prior study from 03/25/2022. FINDINGS: Alignment: Examination is moderately to severely degraded by motion artifact, limiting assessment. Vertebral bodies normally aligned with preservation of the normal cervical lordosis. No listhesis. Vertebrae: Vertebral body height maintained without acute or chronic fracture. Bone marrow signal intensity within normal limits. No discrete or worrisome osseous lesions. No abnormal marrow edema or enhancement. Cord: Evaluation of the cervical spinal cord limited by motion. No obvious cord signal changes to suggest demyelinating disease. No abnormal enhancement. Posterior Fossa, vertebral arteries, paraspinal tissues: Paraspinous soft tissues grossly within normal limits. Disc levels: No more than mild multilevel noncompressive disc bulging noted within the cervical spine. No significant spinal stenosis. Evaluation for foraminal encroachment limited by motion. IMPRESSION: 1. Technically limited exam due to extensive motion artifact. No obvious cord signal changes to suggest demyelinating disease. No abnormal enhancement. 2. Mild multilevel cervical spondylosis without significant stenosis. Electronically Signed   By: Rise Mu M.D.   On: 03/28/2022 20:05   MR BRAIN W WO CONTRAST  Result Date: 03/28/2022 CLINICAL DATA:  Follow-up examination for multiple sclerosis, lower extremity weakness. EXAM:  MRI HEAD WITHOUT AND WITH CONTRAST TECHNIQUE: Multiplanar, multiecho pulse sequences of the brain and surrounding structures were obtained without and with intravenous contrast. CONTRAST:  64mL GADAVIST GADOBUTROL 1 MMOL/ML IV SOLN COMPARISON:   Prior brain MRI from 03/25/2022. FINDINGS: Brain: Current examination has been performed with a mildly improved degree of motion as compared to previous, however, examination remains fairly limited and motion degraded. Mildly advanced cerebral atrophy for age. Patchy and confluent T2/FLAIR hyperintensities involving the periventricular, deep, and juxta cortical white matter of both cerebral hemispheres, consistent with history of multiple sclerosis. The largest lesion again noted at the posterior left periventricular white matter and measures up to approximately 2.2 cm. Associated incomplete ring of diffusion signal abnormality about this lesion is seen (series 6, image 79), similar to previous. Probable associated subtle ADC correlate noted as well (series 7, image 29). Although no clear associated enhancement seen on the motion degraded postcontrast sequences, overall appearance of the diffusion signal change suspected to reflect a degree of associated active demyelination. No other clear enhancing lesions or evidence for active demyelination elsewhere within the brain. No evidence for acute or subacute infarct. No areas of chronic cortical infarction. No acute or chronic intracranial blood products. No mass lesion, mass effect or midline shift. No hydrocephalus or extra-axial fluid collection. Pituitary gland suprasellar region normal. Midline structures intact and normally formed. No other appreciable abnormal enhancement. Vascular: Major intracranial vascular flow voids are maintained at the skull base. Skull and upper cervical spine: Craniocervical junction within normal limits. Bone marrow signal intensity grossly normal without focal marrow replacing lesion. No scalp soft tissue abnormality. Sinuses/Orbits: Globes and orbital soft tissues within normal limits. Mild scattered mucosal thickening noted about the ethmoidal air cells and maxillary sinuses. No mastoid effusion. Other: None. IMPRESSION: 1.  Technically limited exam due to extension motion artifact. 2. Patchy and confluent T2/FLAIR hyperintensities involving the supratentorial cerebral white matter, consistent with provided history of multiple sclerosis. Associated incomplete ring of diffusion signal abnormality about a dominant lesion at the left parietal periventricular white matter suspected to reflect a degree of active demyelination. No other definite or convincing evidence for active demyelination elsewhere on this motion degraded exam. Electronically Signed   By: Rise Mu M.D.   On: 03/28/2022 19:45    Procedures Procedures    Medications Ordered in ED Medications - No data to display  ED Course/ Medical Decision Making/ A&P                           Medical Decision Making  36 year old male with past medical history that is significant for MS who was recently admitted for MS flareup receiving IV Solu-Medrol who chose to leave AMA today presents to the emergency room.  He states he was brought in by a nurse.  Initially he expressed frustration and wanted to leave stating he does not want to be here.  During my discussion it is evident that patient has capacity to make this decision regarding staying for treatment or leaving AMA.  I did convey this to the patient.  After discussion patient states now that he is back in the emergency room that he wishes to stay and complete his treatment.  I confirmed with the patient that if we proceed with admission that he would be willing to stay to complete his treatment.  He states he will stay.  He had blood work this morning prior to leaving AMA.  His CBC showed  leukocytosis of 22.1 which is likely reactive given IV steroids.  Without anemia.  BMP with glucose of 135 otherwise without acute findings. Discussed with hospitalist who will admit patient.  Solu-Medrol 1 g ordered given he did not receive his dose prior to leaving AMA today.  We will notify neurology that patient has  returned for further treatment.  Nurse notified me that patient eloped. As noted above patient has capacity to make decision to leave AMA. I made hospitalist aware of this.   Final Clinical Impression(s) / ED Diagnoses Final diagnoses:  None    Rx / DC Orders ED Discharge Orders     None         Marita Kansas, PA-C 03/29/22 1610    Gwyneth Sprout, MD 04/03/22 [phone]

## 2022-03-29 NOTE — TOC Progression Note (Signed)
Transition of Care [facility]) - Progression Note    Patient Details  Name: Shane Ramirez MRN: [id number] Date of Birth: 05-24-1986  Transition of [facility] CM/SW Contact  Amada Jupiter, LCSW Phone Number: 03/29/2022, 1:01 PM  Clinical Narrative:    Have secured a new patient appointment with [facility] on Monday, 04/02/22 @ 3:30 with Bertram Denver, NP. Pt informed and information on AVS.     Expected Discharge Plan: Homeless Shelter Barriers to Discharge: Continued Medical Work up, Inadequate or no insurance  Expected Discharge Plan and Services Expected Discharge Plan: Homeless Shelter In-house Referral: Clinical Social Work Discharge Planning Services: Indigent Health Clinic, Medication Assistance, [facility] Program   Living arrangements for the past 2 months: Homeless                                       Social Determinants of Health (SDOH) Interventions    Readmission Risk Interventions    03/29/2022    1:01 PM  Readmission Risk Prevention Plan  Post Dischage Appt Complete  Medication Screening Complete  Transportation Screening Complete

## 2022-03-29 NOTE — ED Triage Notes (Signed)
BIB [facility] with c/o leg pain. Hx of MS. Left hospital AMA earlier today and seen the ED already today. Ambulatory with walker at baseline. Hx of homelessness.

## 2022-03-29 NOTE — ED Notes (Signed)
Pt stated he didn't want to be here and wanted to leave. Pt walked out from triage room 5 out to lobby.

## 2022-03-29 NOTE — Progress Notes (Signed)
Pt very agitated and uncooperative today, pulled IV out earlier this am, initially refused IV restart but did let IV team restart (insisted on no tape and for [place] to use the [facility]). Pt later cursing this nurse bc IV was alarming occluded, and pulled his new IV out. Attempted to explain to pt his medical tx (IV steroids for MS flare) but pt continued to curse loudly at me, AMA form signed and pt left the floor w his walker. MD notified

## 2022-03-29 NOTE — H&P (Addendum)
History and Physical    Patient: Shane Ramirez MRN: [id number] DOA: 03/29/2022  Date of Service: the patient was seen and examined on 03/30/2022  Patient coming from: [facility] hospital campus  Chief Complaint:  Chief Complaint  Patient presents with   Leg Pain    HPI:   36 year old male with past medical history of multiple sclerosis, diagnosed in 2022 while living in [place] without a outpatient neurologist not on any disease modifying agents who presents to [facility] emergency department with bilateral lower extremity weakness and difficulty with ambulation.  Of note, patient has been hospitalized on the hospitalist service from 6/10 until earlier in the day today 6/15.  Patient was hospitalized for MS flare with the plan for the patient to receive a 5-day course of Solu-Medrol 1000 mg daily.  Patient unfortunately attempted to elope from the hospital earlier in the day on 6/15 and signed out AMA.  Later in the day during the early afternoon the patient was found to still be on campus continuing to have significant leg weakness with difficulty with balance and ambulation.  Patient was then brought back to the emergency department for repeat evaluation and potential readmission.  Upon my discussion with the patient who is visibly angry he does admit to ongoing leg weakness and difficulty with ambulation but denies any other symptoms.  Patient denies any significant pain, headaches, difficulty with speech or changes in vision.  Patient denies any fevers.   Review of Systems: Review of Systems  Musculoskeletal:  Positive for falls.  Neurological:  Positive for weakness.    Past Medical History:  Diagnosis Date   Multiple sclerosis (HCC)     History reviewed. No pertinent surgical history.  Social History:  reports that he has never smoked. He has never been exposed to tobacco smoke. He has never used smokeless tobacco. He reports that he does not currently use alcohol. He  reports that he does not currently use drugs.  No Known Allergies  Family History  Problem Relation Age of Onset   Heart disease Neg Hx     Prior to Admission medications   Not on File    Physical Exam:  Vitals:   03/30/22 0025 03/30/22 0045 03/30/22 0105 03/30/22 0455  BP: 140/65 (!) 174/75 (!) 156/65 (!) 149/77  Pulse: 76 86  88  Resp: 18 18  18   Temp: 97.8 F (36.6 C) 97.7 F (36.5 C)  97.9 F (36.6 C)  TempSrc: Oral Oral  Oral  SpO2: 97% 98%  98%  Weight:      Height:        Constitutional: Awake alert and oriented x3, no associated distress.   Skin: no rashes, no lesions, good skin turgor noted. Eyes: Pupils are equally reactive to light.  No evidence of scleral icterus or conjunctival pallor.  ENMT: Moist mucous membranes noted.  Posterior pharynx clear of any exudate or lesions.   Neck: normal, supple, no masses, no thyromegaly.  No evidence of jugular venous distension.   Respiratory: clear to auscultation bilaterally, no wheezing, no crackles. Normal respiratory effort. No accessory muscle use.  Cardiovascular: Regular rate and rhythm, no murmurs / rubs / gallops. No extremity edema. 2+ pedal pulses. No carotid bruits.  Chest:   Nontender without crepitus or deformity.   Back:   Nontender without crepitus or deformity. Abdomen: Abdomen is soft and nontender.  No evidence of intra-abdominal masses.  Positive bowel sounds noted in all quadrants.   Musculoskeletal: No joint deformity  upper and lower extremities. Good ROM, no contractures. Normal muscle tone.  Neurologic: CN 2-12 grossly intact. Sensation intact.  Nonfocal generalized weakness.  Patient moving all 4 extremities spontaneously.  Patient is following all commands.  Patient is responsive to verbal stimuli.   Psychiatric: Angry mood with flat affect.  Patient seems to possess insight as to their current situation.    Data Reviewed:  I have personally reviewed and interpreted labs, imaging.  Significant  findings are:  Lab Results  Component Value Date   WBC 22.1 (H) 03/29/2022   HGB 15.2 03/29/2022   HCT 45.4 03/29/2022   MCV 88.3 03/29/2022   PLT 244 03/29/2022     Assessment and Plan: * Multiple sclerosis exacerbation (HCC) Patient eloped from medical floor on 6/15 and was discharged AGAINST MEDICAL ADVICE Patient was found wandering the campus, unable to ambulate safely and was brought back to the emergency department Patient is still unable to ambulate it is clear at this point the patient still requires ongoing treatment for his multiple sclerosis with high-dose intravenous steroids at this time Review of the last hospitalization reveals the patient only received 2 of the 5 doses of Solu-Medrol, we will go ahead and administer dose 3 now Initiating PPI as a form of GI prophylaxis PT evaluation If patient fails to clinically improve will consider reconsulting neurology During last hospitalization neurology recommended exploring whether arrangements can be made for the patient to receive Amypra in the outpatient setting  Recurrent falls Patient found down by staff while attempting to elope from [place] [facility] campus on 6/15 No evidence of significant injury Refer to assessment and plan above PT evaluation placed  Homeless In this patient appropriate follow-up with neurology as well as appropriate outpatient treatment of his multiple sclerosis will prove to be a challenge continuing patient's social situation Case management consult placed       Code Status:  Full code  code status decision has been confirmed with: patient Family Communication: deferred   Consults: None, consider neurology consultation based on clinical course  Severity of Illness:  The appropriate patient status for this patient is INPATIENT. Inpatient status is judged to be reasonable and necessary in order to provide the required intensity of service to ensure the patient's safety. The patient's  presenting symptoms, physical exam findings, and initial radiographic and laboratory data in the context of their chronic comorbidities is felt to place them at high risk for further clinical deterioration. Furthermore, it is not anticipated that the patient will be medically stable for discharge from the hospital within 2 midnights of admission.   * I certify that at the point of admission it is my clinical judgment that the patient will require inpatient hospital care spanning beyond 2 midnights from the point of admission due to high intensity of service, high risk for further deterioration and high frequency of surveillance required.*  Author:  Marinda Elk MD  03/30/2022 8:32 AM

## 2022-03-29 NOTE — ED Notes (Signed)
Pt laid himself on the grass outside of ER lobby. Pt was approached by multiple staff members and was asked if he was going to come back in to be seen or if he was going to leave. Pt stated he was going to stay. Pt was assisted into wheelchair by staff and brought back into triage room 5.

## 2022-03-29 NOTE — ED Notes (Signed)
  Patient assisted to bathroom and tolerated well.  Given meal tray and juice.  Patient has no pain at this time.

## 2022-03-29 NOTE — Plan of Care (Signed)
?  Problem: Clinical Measurements: ?Goal: Will remain free from infection ?Outcome: Progressing ?  ?

## 2022-03-29 NOTE — ED Provider Notes (Signed)
[facility] [place] [facility]-EMERGENCY DEPT Provider Note   CSN: [id number] Arrival date & time: 03/29/22  2012     History  Chief Complaint  Patient presents with   Leg Pain    Kamron Vanwyhe is a 36 y.o. male.  [facility]ed to the [facility] apartment due to concern for leg weakness.  Patient was recently admitted for MS flare, leg weakness.  Started on high-dose IV steroids, per my review of patient's chart he received steroids on 6/14, 6/13 but not 6/15.  He left AMA earlier today.  Patient states that he is interested in getting admitted again and resuming treatment.  He denies any new symptoms.  Has been able to walk with significant difficulty, requiring a walker.  HPI     Home Medications Prior to Admission medications   Not on File      Allergies    Patient has no known allergies.    Review of Systems   Review of Systems  Constitutional:  Negative for chills and fever.  HENT:  Negative for ear pain and sore throat.   Eyes:  Negative for pain and visual disturbance.  Respiratory:  Negative for cough and shortness of breath.   Cardiovascular:  Negative for chest pain and palpitations.  Gastrointestinal:  Negative for abdominal pain and vomiting.  Genitourinary:  Negative for dysuria and hematuria.  Musculoskeletal:  Positive for arthralgias. Negative for back pain.  Skin:  Negative for color change and rash.  Neurological:  Positive for weakness. Negative for seizures and syncope.  All other systems reviewed and are negative.   Physical Exam Updated Vital Signs BP (!) 143/77 (BP Location: Right Arm)   Pulse 77   Temp 99.1 F (37.3 C) (Oral)   Resp 18   Ht 6\' 4"  (1.93 m)   Wt 102.1 kg   SpO2 95%   BMI 27.40 kg/m  Physical Exam Vitals and nursing note reviewed.  Constitutional:      General: He is not in acute distress.    Appearance: He is well-developed.  HENT:     Head: Normocephalic and atraumatic.  Eyes:     Conjunctiva/sclera: Conjunctivae normal.   Cardiovascular:     Rate and Rhythm: Normal rate and regular rhythm.     Heart sounds: No murmur heard. Pulmonary:     Effort: Pulmonary effort is normal. No respiratory distress.     Breath sounds: Normal breath sounds.  Abdominal:     Palpations: Abdomen is soft.     Tenderness: There is no abdominal tenderness.  Musculoskeletal:        General: No swelling or deformity.     Cervical back: Neck supple.  Skin:    General: Skin is warm and dry.     Capillary Refill: Capillary refill takes less than 2 seconds.  Neurological:     Mental Status: He is alert.     Comments: 4 out of 5 strength in both of his legs, 5 out of 5 strength in both of his arms; normal sensation throughout all 4 extremities  Psychiatric:        Mood and Affect: Mood normal.     ED Results / Procedures / Treatments   Labs (all labs ordered are listed, but only abnormal results are displayed) Labs Reviewed - No data to display  EKG None  Radiology MR CERVICAL SPINE W WO CONTRAST  Result Date: 03/28/2022 CLINICAL DATA:  Follow-up examination for multiple sclerosis, myelopathy. EXAM: MRI CERVICAL SPINE WITHOUT AND WITH CONTRAST  TECHNIQUE: Multiplanar and multiecho pulse sequences of the cervical spine, to include the craniocervical junction and cervicothoracic junction, were obtained without and with intravenous contrast. CONTRAST:  18mL GADAVIST GADOBUTROL 1 MMOL/ML IV SOLN COMPARISON:  Prior study from 03/25/2022. FINDINGS: Alignment: Examination is moderately to severely degraded by motion artifact, limiting assessment. Vertebral bodies normally aligned with preservation of the normal cervical lordosis. No listhesis. Vertebrae: Vertebral body height maintained without acute or chronic fracture. Bone marrow signal intensity within normal limits. No discrete or worrisome osseous lesions. No abnormal marrow edema or enhancement. Cord: Evaluation of the cervical spinal cord limited by motion. No obvious cord signal  changes to suggest demyelinating disease. No abnormal enhancement. Posterior Fossa, vertebral arteries, paraspinal tissues: Paraspinous soft tissues grossly within normal limits. Disc levels: No more than mild multilevel noncompressive disc bulging noted within the cervical spine. No significant spinal stenosis. Evaluation for foraminal encroachment limited by motion. IMPRESSION: 1. Technically limited exam due to extensive motion artifact. No obvious cord signal changes to suggest demyelinating disease. No abnormal enhancement. 2. Mild multilevel cervical spondylosis without significant stenosis. Electronically Signed   By: Rise Mu M.D.   On: 03/28/2022 20:05   MR BRAIN W WO CONTRAST  Result Date: 03/28/2022 CLINICAL DATA:  Follow-up examination for multiple sclerosis, lower extremity weakness. EXAM: MRI HEAD WITHOUT AND WITH CONTRAST TECHNIQUE: Multiplanar, multiecho pulse sequences of the brain and surrounding structures were obtained without and with intravenous contrast. CONTRAST:  26mL GADAVIST GADOBUTROL 1 MMOL/ML IV SOLN COMPARISON:  Prior brain MRI from 03/25/2022. FINDINGS: Brain: Current examination has been performed with a mildly improved degree of motion as compared to previous, however, examination remains fairly limited and motion degraded. Mildly advanced cerebral atrophy for age. Patchy and confluent T2/FLAIR hyperintensities involving the periventricular, deep, and juxta cortical white matter of both cerebral hemispheres, consistent with history of multiple sclerosis. The largest lesion again noted at the posterior left periventricular white matter and measures up to approximately 2.2 cm. Associated incomplete ring of diffusion signal abnormality about this lesion is seen (series 6, image 79), similar to previous. Probable associated subtle ADC correlate noted as well (series 7, image 29). Although no clear associated enhancement seen on the motion degraded postcontrast sequences,  overall appearance of the diffusion signal change suspected to reflect a degree of associated active demyelination. No other clear enhancing lesions or evidence for active demyelination elsewhere within the brain. No evidence for acute or subacute infarct. No areas of chronic cortical infarction. No acute or chronic intracranial blood products. No mass lesion, mass effect or midline shift. No hydrocephalus or extra-axial fluid collection. Pituitary gland suprasellar region normal. Midline structures intact and normally formed. No other appreciable abnormal enhancement. Vascular: Major intracranial vascular flow voids are maintained at the skull base. Skull and upper cervical spine: Craniocervical junction within normal limits. Bone marrow signal intensity grossly normal without focal marrow replacing lesion. No scalp soft tissue abnormality. Sinuses/Orbits: Globes and orbital soft tissues within normal limits. Mild scattered mucosal thickening noted about the ethmoidal air cells and maxillary sinuses. No mastoid effusion. Other: None. IMPRESSION: 1. Technically limited exam due to extension motion artifact. 2. Patchy and confluent T2/FLAIR hyperintensities involving the supratentorial cerebral white matter, consistent with provided history of multiple sclerosis. Associated incomplete ring of diffusion signal abnormality about a dominant lesion at the left parietal periventricular white matter suspected to reflect a degree of active demyelination. No other definite or convincing evidence for active demyelination elsewhere on this motion degraded exam. Electronically Signed  By: Jeannine Boga M.D.   On: 03/28/2022 19:45    Procedures Procedures    Medications Ordered in ED Medications  methylPREDNISolone sodium succinate (SOLU-MEDROL) 1,000 mg in sodium chloride 0.9 % 50 mL IVPB (has no administration in time range)    ED Course/ Medical Decision Making/ A&P                           Medical  Decision Making Risk Decision regarding hospitalization.   36 year old male with history of multiple sclerosis presented to ER for right leg weakness.  Recently hospitalized, left AMA earlier in the day.  Per neurology note from yesterday, they were doing high-dose IV steroids.  Patient now agreeable to be readmitted.  I reviewed labs from earlier today, grossly stable.  Discussed with Dr. Cheral Marker on-call for neurology, states patient okay to stay at [facility], recommending continuing IV steroids.  Discussed case with Dr. Cyd Silence who will admit.  Additional history obtained from review of recent discharge summary, review of neurology notes.        Final Clinical Impression(s) / ED Diagnoses Final diagnoses:  Multiple sclerosis [facility])    Rx / DC Orders ED Discharge Orders     None         Lucrezia Starch, MD 03/30/22 0009

## 2022-03-29 NOTE — ED Triage Notes (Addendum)
Patient was a patient on [address] and left AMA. The nurse [address] found him wandering around the hospital and brought him to the ED to check him in.  Patient states tht he does not want to be here, but a nurse made him come and told him he could not leave.

## 2022-03-30 ENCOUNTER — Encounter (HOSPITAL_COMMUNITY): Payer: Self-pay | Admitting: Internal Medicine

## 2022-03-30 LAB — GLUCOSE, CAPILLARY
Glucose-Capillary: 133 mg/dL — ABNORMAL HIGH (ref 70–99)
Glucose-Capillary: 223 mg/dL — ABNORMAL HIGH (ref 70–99)
Glucose-Capillary: 154 mg/dL — ABNORMAL HIGH (ref 70–99)
Glucose-Capillary: 133 mg/dL — ABNORMAL HIGH (ref 70–99)

## 2022-03-30 MED ORDER — ENOXAPARIN SODIUM 40 MG/0.4ML IJ SOSY
40.0000 mg | PREFILLED_SYRINGE | INTRAMUSCULAR | Status: DC
  Administered 2022-03-30: 40 mg via SUBCUTANEOUS
  Filled 2022-03-30 (×2): qty 0.4

## 2022-03-30 MED ORDER — INSULIN ASPART 100 UNIT/ML IJ SOLN
0.0000 [IU] | Freq: Three times a day (TID) | INTRAMUSCULAR | Status: DC
  Administered 2022-03-30: 5 [IU] via SUBCUTANEOUS

## 2022-03-30 MED ORDER — ONDANSETRON HCL 4 MG PO TABS: 4.0000 mg | ORAL_TABLET | Freq: Four times a day (QID) | ORAL | Status: DC | PRN

## 2022-03-30 MED ORDER — POLYETHYLENE GLYCOL 3350 17 G PO PACK
17.0000 g | PACK | Freq: Every day | ORAL | Status: DC | PRN
Start: 2022-03-30 — End: 2022-03-31

## 2022-03-30 MED ORDER — ACETAMINOPHEN 650 MG RE SUPP: 650.0000 mg | Freq: Four times a day (QID) | RECTAL | Status: DC | PRN

## 2022-03-30 MED ORDER — PANTOPRAZOLE SODIUM 40 MG PO TBEC
40.0000 mg | DELAYED_RELEASE_TABLET | Freq: Every day | ORAL | Status: DC
Start: 2022-03-30 — End: 2022-03-31
  Administered 2022-03-30 – 2022-03-31 (×2): 40 mg via ORAL
  Filled 2022-03-30 (×2): qty 1

## 2022-03-30 MED ORDER — ONDANSETRON HCL 4 MG/2ML IJ SOLN: 4.0000 mg | Freq: Four times a day (QID) | INTRAMUSCULAR | Status: DC | PRN

## 2022-03-30 MED ORDER — ACETAMINOPHEN 325 MG PO TABS: 650.0000 mg | ORAL_TABLET | Freq: Four times a day (QID) | ORAL | Status: DC | PRN

## 2022-03-30 NOTE — Progress Notes (Signed)
  Progress Note Patient: Shane Ramirez GDJ:[id number] DOB: 08/17/1986 DOA: 03/29/2022  DOS: the patient was seen and examined on 03/30/2022  Brief hospital course: Past medical history significant for MS not on any medication.  Originally from [place] and now from [place]. Diagnosed with MS in 2022.  Never started on disease modifying therapy.   Moved to [place] trying to find his brother. Admitted on 6/10 for MS flareup started on IV Solu-Medrol after initial work-up. Left AMA on 6/15 after receiving 2 doses of IV Solu-Medrol. Came back to ER, left AMA from ER on 6/15. Brought back to ER again on 6/15 and now admitted.  Assessment and Plan: Multiple sclerosis flareup. Recurrent fall. Patient tells me that he is only able to stand for 2 to 3 minutes at his baseline. Patient told me that from [place], somebody brought him in to get and he said on the bus and was just admitted to the hospital ever since he has arrived to [place]. Patient does not see any significant difference from his baseline in terms of any deficit. MRI brain and MRI C-spine without contrast shows evidence of T2 weighted hyperintense imaging concerning for possible flareup. Neurology was consulted last admission and recommended 5 days of IV Solu-Medrol 1 g daily. Patient completed 2 courses prior to this admission.  And will complete 3 courses.  Last day will still be 6/17. Will monitor neurology and PT recommendation.  Homeless nurse. Social worker consulted. Patient will be provided resources for local shelter.  Diarrhea. Patient reported some diarrhea prior to this admission. Currently resolved.  Subjective: No nausea no vomiting no fever no chills.  Does not feel any different from his baseline.  Tells me that he went to lay down on the grass outside of the hospital and was brought in twice.  Physical Exam: Vitals:   03/30/22 0455 03/30/22 0909 03/30/22 1254 03/30/22 1640  BP: (!) 149/77 (!) 117/102  (!) 170/67 (!) 143/71  Pulse: 88 79 87 81  Resp: 18 15 16 16   Temp: 97.9 F (36.6 C) 98.1 F (36.7 C) 98.2 F (36.8 C) 97.8 F (36.6 C)  TempSrc: Oral Oral Oral Oral  SpO2: 98% 100% 100% 100%  Weight:      Height:       General: Appear in mild distress; no visible Abnormal Neck Mass Or lumps, Conjunctiva normal Cardiovascular: S1 and S2 Present, no Murmur, Respiratory: good respiratory effort, Bilateral Air entry present and CTA, no Crackles, no wheezes Abdomen: Bowel Sound present, Non tender  Extremities: no Pedal edema Neurology: alert and oriented to time, place, and person  Gait not checked due to patient safety concerns   Data Reviewed: I have Reviewed nursing notes, Vitals, and Lab results since pt's last encounter. Pertinent lab results CBC and BMP I have ordered test including CBC and BMP    Family Communication: None at bedside  Disposition: Status is: Inpatient Remains inpatient appropriate because: Receiving IV steroids.  Author: , MD 03/30/2022 6:37 PM  Please look on [email or web address] to find out who is on call.

## 2022-03-30 NOTE — Assessment & Plan Note (Addendum)
   Patient eloped from medical floor on 6/15 and was discharged AGAINST MEDICAL ADVICE  Patient was found wandering the campus, unable to ambulate safely and was brought back to the emergency department  Patient is still unable to ambulate it is clear at this point the patient still requires ongoing treatment for his multiple sclerosis with high-dose intravenous steroids at this time  Review of the last hospitalization reveals the patient only received 2 of the 5 doses of Solu-Medrol, we will go ahead and administer dose 3 now  Initiating PPI as a form of GI prophylaxis  PT evaluation  If patient fails to clinically improve will consider reconsulting neurology  During last hospitalization neurology recommended exploring whether arrangements can be made for the patient to receive Amypra in the outpatient setting

## 2022-03-30 NOTE — Hospital Course (Signed)
Past medical history significant for MS not on any medication.  Originally from [place] and now from [place]. Diagnosed with MS in 2022.  Never started on disease modifying therapy.   Moved to [place] trying to find his brother. Admitted on 6/10 for MS flareup started on IV Solu-Medrol after initial work-up. Left AMA on 6/15 after receiving 2 doses of IV Solu-Medrol. Came back to ER, left AMA from ER on 6/15. Brought back to ER again on 6/15 and now admitted.

## 2022-03-30 NOTE — Progress Notes (Signed)
On call provider Newton Pigg, DO made aware , orders to hold on getting new IV access tonight and will evaluate in the morning.We will continue to monitor.

## 2022-03-30 NOTE — Progress Notes (Signed)
Pt arrived to floor, alert and oriented x 4. Oriented pt with patient care.

## 2022-03-30 NOTE — TOC Progression Note (Signed)
Transition of Care [facility]) - Progression Note    Patient Details  Name: Curlee Bogan MRN: [id number] Date of Birth: [birth year]  [facility]) CM/SW Contact  Lennart Pall, LCSW Phone Number: 03/30/2022, 1:22 PM  Clinical Narrative:    Met with pt x 2 again today after pt having left AMA yesterday afternoon and readmitted in the evening.  Pt remains very disgruntled and very difficult to engage.  Explained to pt that we cannot assist with SNF rehab placement as he is uninsured.  At this time, pt states his intention is to remain in this area, so I have provided him with a packet of [employer] information and have stressed to him that he should connect with the [facility] on Monday who can assist him with making connections with area shelters as well as many other potential needs.  He has an appointment on Monday 6/19 with [facility] or new PCP with info on AVS.  TOC can provide with bus pass when he is discharged.     Barriers to Discharge: Continued Medical Work up, Inadequate or no insurance  Expected Discharge Plan and Services                                                 Social Determinants of Health (SDOH) Interventions    Readmission Risk Interventions    03/30/2022    1:21 PM 03/29/2022    1:01 PM  Readmission Risk Prevention Plan  Post Dischage Appt Complete Complete  Medication Screening Complete Complete  Transportation Screening Complete Complete

## 2022-03-30 NOTE — Progress Notes (Signed)
Patient was found to have no IV access when asked where is his IV access the patient responded " it's gone" . Patient was then instructed that he needs a new IV access for his IV medication patient responded " I don't care whatever" . IV nurse came and tried to put a new IV access and patient was not being cooperative, he refused to be stick else where and verbalized that he pull it out. We will notify on call provider.

## 2022-03-30 NOTE — Assessment & Plan Note (Signed)
   In this patient appropriate follow-up with neurology as well as appropriate outpatient treatment of his multiple sclerosis will prove to be a challenge continuing patient's social situation  Case management consult placed

## 2022-03-30 NOTE — Progress Notes (Signed)
Consulted to restart I.V. that patient pulled out. Patient refuses to allow the I.V. to be placed in his forearm or hands where he has very good veins, wants the I.V. placed in his Antecubital where he does not have a good vein. Patient states he refuses to be stuck anywhere else or he will pull the I.V. out. RN aware.

## 2022-03-30 NOTE — Assessment & Plan Note (Signed)
   Patient found down by staff while attempting to elope from [facility] on 6/15  No evidence of significant injury  Refer to assessment and plan above  PT evaluation placed

## 2022-03-31 ENCOUNTER — Encounter (HOSPITAL_COMMUNITY): Payer: Self-pay

## 2022-03-31 ENCOUNTER — Emergency Department (HOSPITAL_COMMUNITY)
Admission: EM | Admit: 2022-03-31 | Discharge: 2022-03-31 | Discharge status: Against Medical Advice | Payer: Self-pay | Attending: Emergency Medicine | Admitting: Emergency Medicine

## 2022-03-31 ENCOUNTER — Emergency Department (HOSPITAL_COMMUNITY): Payer: Self-pay

## 2022-03-31 ENCOUNTER — Emergency Department (HOSPITAL_COMMUNITY)
Admission: EM | Admit: 2022-03-31 | Discharge: 2022-04-01 | Disposition: A | Discharge status: Home / Self Care | Payer: Self-pay | Attending: Emergency Medicine | Admitting: Emergency Medicine

## 2022-03-31 ENCOUNTER — Emergency Department (HOSPITAL_COMMUNITY)
Admission: EM | Admit: 2022-03-31 | Discharge: 2022-03-31 | Disposition: A | Discharge status: Home / Self Care | Payer: Self-pay | Attending: Emergency Medicine | Admitting: Emergency Medicine

## 2022-03-31 ENCOUNTER — Encounter (HOSPITAL_COMMUNITY): Payer: Self-pay | Admitting: Emergency Medicine

## 2022-03-31 ENCOUNTER — Other Ambulatory Visit: Payer: Self-pay

## 2022-03-31 DIAGNOSIS — Z5900 Homelessness unspecified: Secondary | ICD-10-CM | POA: Insufficient documentation

## 2022-03-31 DIAGNOSIS — R209 Unspecified disturbances of skin sensation: Secondary | ICD-10-CM | POA: Insufficient documentation

## 2022-03-31 DIAGNOSIS — W232XXA Caught, crushed, jammed or pinched between a moving and stationary object, initial encounter: Secondary | ICD-10-CM | POA: Insufficient documentation

## 2022-03-31 DIAGNOSIS — Z5321 Procedure and treatment not carried out due to patient leaving prior to being seen by health care provider: Secondary | ICD-10-CM | POA: Insufficient documentation

## 2022-03-31 DIAGNOSIS — G35 Multiple sclerosis: Secondary | ICD-10-CM

## 2022-03-31 DIAGNOSIS — R200 Anesthesia of skin: Secondary | ICD-10-CM

## 2022-03-31 DIAGNOSIS — M791 Myalgia, unspecified site: Secondary | ICD-10-CM | POA: Insufficient documentation

## 2022-03-31 DIAGNOSIS — S6992XA Unspecified injury of left wrist, hand and finger(s), initial encounter: Secondary | ICD-10-CM | POA: Insufficient documentation

## 2022-03-31 DIAGNOSIS — R531 Weakness: Secondary | ICD-10-CM | POA: Insufficient documentation

## 2022-03-31 LAB — BASIC METABOLIC PANEL
Anion gap: 11 (ref 5–15)
BUN: 27 mg/dL — ABNORMAL HIGH (ref 6–20)
CO2: 23 mmol/L (ref 22–32)
Calcium: 8.7 mg/dL — ABNORMAL LOW (ref 8.9–10.3)
Chloride: 104 mmol/L (ref 98–111)
Creatinine, Ser: 1.12 mg/dL (ref 0.61–1.24)
GFR, Estimated: >60 mL/min (ref >60)
Glucose, Bld: 134 mg/dL — ABNORMAL HIGH (ref 70–99)
Potassium: 4 mmol/L (ref 3.5–5.1)
Sodium: 138 mmol/L (ref 135–145)

## 2022-03-31 LAB — COMPREHENSIVE METABOLIC PANEL
ALT: 37 U/L (ref 0–44)
ALT: 39 U/L (ref 0–44)
AST: 18 U/L (ref 15–41)
AST: 19 U/L (ref 15–41)
Albumin: 2.8 g/dL — ABNORMAL LOW (ref 3.5–5.0)
Albumin: 3.2 g/dL — ABNORMAL LOW (ref 3.5–5.0)
Alkaline Phosphatase: 51 U/L (ref 38–126)
Alkaline Phosphatase: 56 U/L (ref 38–126)
Anion gap: 3 — ABNORMAL LOW (ref 5–15)
Anion gap: 6 (ref 5–15)
BUN: 34 mg/dL — ABNORMAL HIGH (ref 6–20)
BUN: 33 mg/dL — ABNORMAL HIGH (ref 6–20)
CO2: 27 mmol/L (ref 22–32)
CO2: 27 mmol/L (ref 22–32)
Calcium: 8.3 mg/dL — ABNORMAL LOW (ref 8.9–10.3)
Calcium: 8.3 mg/dL — ABNORMAL LOW (ref 8.9–10.3)
Chloride: 110 mmol/L (ref 98–111)
Chloride: 108 mmol/L (ref 98–111)
Creatinine, Ser: 0.85 mg/dL (ref 0.61–1.24)
Creatinine, Ser: 1.06 mg/dL (ref 0.61–1.24)
GFR, Estimated: >60 mL/min (ref >60)
GFR, Estimated: >60 mL/min (ref >60)
Glucose, Bld: 87 mg/dL (ref 70–99)
Glucose, Bld: 87 mg/dL (ref 70–99)
Potassium: 3.7 mmol/L (ref 3.5–5.1)
Potassium: 3.6 mmol/L (ref 3.5–5.1)
Sodium: 140 mmol/L (ref 135–145)
Sodium: 141 mmol/L (ref 135–145)
Total Bilirubin: 0.5 mg/dL (ref 0.3–1.2)
Total Bilirubin: 0.5 mg/dL (ref 0.3–1.2)
Total Protein: 5.2 g/dL — ABNORMAL LOW (ref 6.5–8.1)
Total Protein: 5.7 g/dL — ABNORMAL LOW (ref 6.5–8.1)

## 2022-03-31 LAB — CBC WITH DIFFERENTIAL/PLATELET
Abs Immature Granulocytes: 0.19 10*3/uL — ABNORMAL HIGH (ref 0.00–0.07)
Abs Immature Granulocytes: 0.15 10*3/uL — ABNORMAL HIGH (ref 0.00–0.07)
Abs Immature Granulocytes: 0.16 10*3/uL — ABNORMAL HIGH (ref 0.00–0.07)
Basophils Absolute: 0 10*3/uL (ref 0.0–0.1)
Basophils Absolute: 0 10*3/uL (ref 0.0–0.1)
Basophils Absolute: 0 10*3/uL (ref 0.0–0.1)
Basophils Relative: 0 %
Basophils Relative: 0 %
Basophils Relative: 0 %
Eosinophils Absolute: 0 10*3/uL (ref 0.0–0.5)
Eosinophils Absolute: 0 10*3/uL (ref 0.0–0.5)
Eosinophils Absolute: 0 10*3/uL (ref 0.0–0.5)
Eosinophils Relative: 0 %
Eosinophils Relative: 0 %
Eosinophils Relative: 0 %
HCT: 38.7 % — ABNORMAL LOW (ref 39.0–52.0)
HCT: 40.2 % (ref 39.0–52.0)
HCT: 43.3 % (ref 39.0–52.0)
Hemoglobin: 12.8 g/dL — ABNORMAL LOW (ref 13.0–17.0)
Hemoglobin: 13.3 g/dL (ref 13.0–17.0)
Hemoglobin: 14.8 g/dL (ref 13.0–17.0)
Immature Granulocytes: 1 %
Immature Granulocytes: 1 %
Immature Granulocytes: 2 %
Lymphocytes Relative: 18 %
Lymphocytes Relative: 29 %
Lymphocytes Relative: 13 %
Lymphs Abs: 2.6 10*3/uL (ref 0.7–4.0)
Lymphs Abs: 3.3 10*3/uL (ref 0.7–4.0)
Lymphs Abs: 1.1 10*3/uL (ref 0.7–4.0)
MCH: 29.4 pg (ref 26.0–34.0)
MCH: 29.4 pg (ref 26.0–34.0)
MCH: 29.8 pg (ref 26.0–34.0)
MCHC: 33.1 g/dL (ref 30.0–36.0)
MCHC: 33.1 g/dL (ref 30.0–36.0)
MCHC: 34.2 g/dL (ref 30.0–36.0)
MCV: 88.8 fL (ref 80.0–100.0)
MCV: 88.7 fL (ref 80.0–100.0)
MCV: 87.1 fL (ref 80.0–100.0)
Monocytes Absolute: 2 10*3/uL — ABNORMAL HIGH (ref 0.1–1.0)
Monocytes Absolute: 1.6 10*3/uL — ABNORMAL HIGH (ref 0.1–1.0)
Monocytes Absolute: 0.2 10*3/uL (ref 0.1–1.0)
Monocytes Relative: 14 %
Monocytes Relative: 14 %
Monocytes Relative: 2 %
Neutro Abs: 9.5 10*3/uL — ABNORMAL HIGH (ref 1.7–7.7)
Neutro Abs: 6.5 10*3/uL (ref 1.7–7.7)
Neutro Abs: 7.1 10*3/uL (ref 1.7–7.7)
Neutrophils Relative %: 67 %
Neutrophils Relative %: 56 %
Neutrophils Relative %: 83 %
Platelets: 207 10*3/uL (ref 150–400)
Platelets: 216 10*3/uL (ref 150–400)
Platelets: 230 10*3/uL (ref 150–400)
RBC: 4.36 MIL/uL (ref 4.22–5.81)
RBC: 4.53 MIL/uL (ref 4.22–5.81)
RBC: 4.97 MIL/uL (ref 4.22–5.81)
RDW: 14.3 % (ref 11.5–15.5)
RDW: 14.4 % (ref 11.5–15.5)
RDW: 14.1 % (ref 11.5–15.5)
WBC: 14.2 10*3/uL — ABNORMAL HIGH (ref 4.0–10.5)
WBC: 11.7 10*3/uL — ABNORMAL HIGH (ref 4.0–10.5)
WBC: 8.6 10*3/uL (ref 4.0–10.5)
nRBC: 0 % (ref 0.0–0.2)
nRBC: 0 % (ref 0.0–0.2)
nRBC: 0 % (ref 0.0–0.2)

## 2022-03-31 LAB — MAGNESIUM: Magnesium: 2.3 mg/dL (ref 1.7–2.4)

## 2022-03-31 IMAGING — CR DG HAND COMPLETE 3+V*L*
3 series · 3 of 3 positions shown · non-contrast
Comparison: None Available.

CLINICAL DATA: injury/pain

EXAM:
LEFT HAND - COMPLETE 3+ VIEW

[x hand obl left]
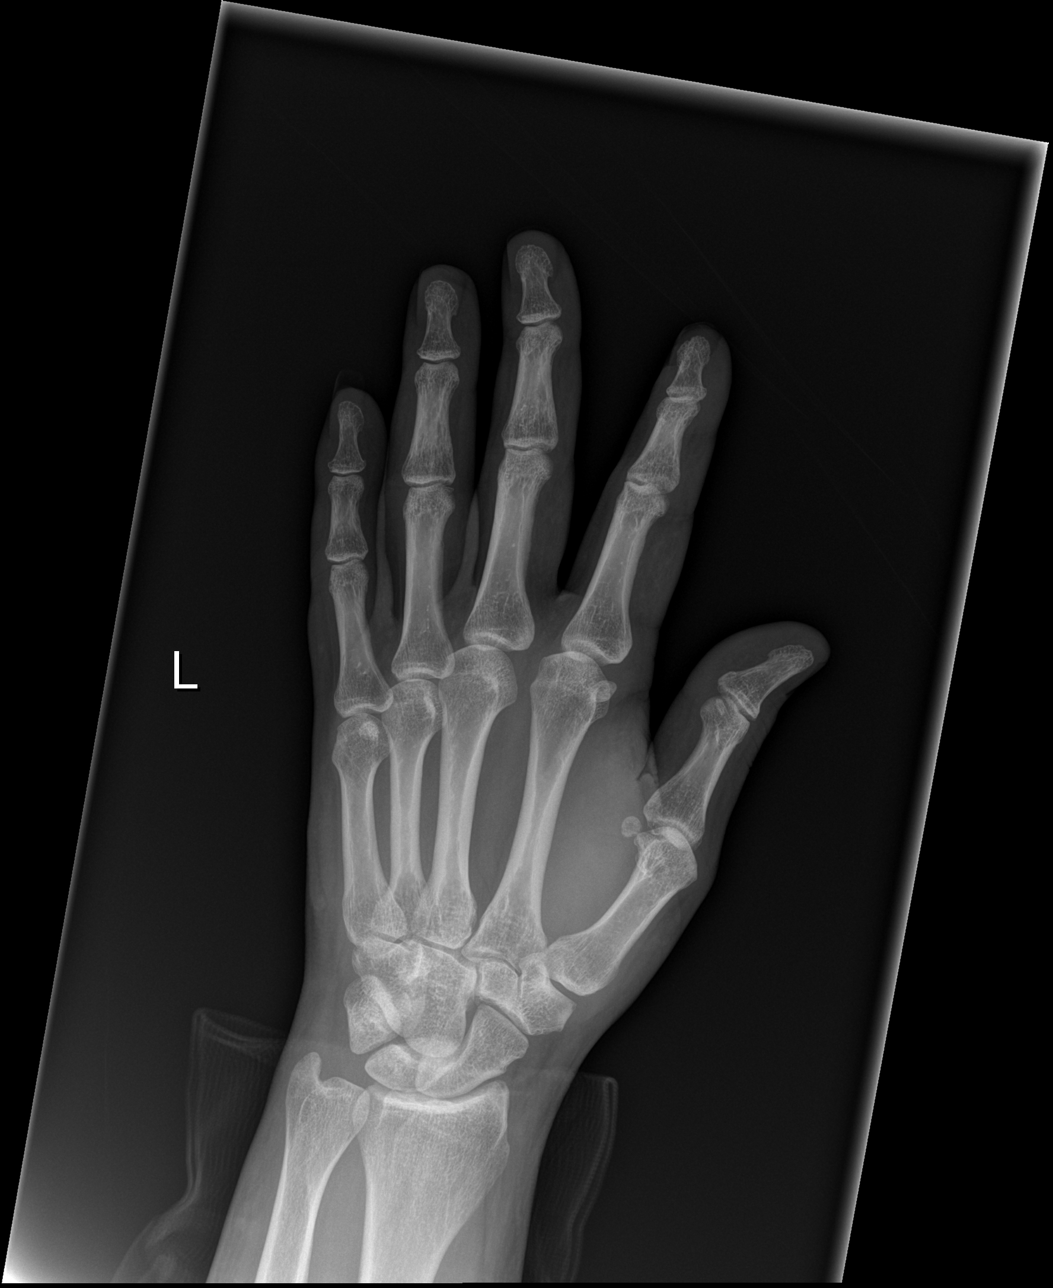

[x hand pa left]
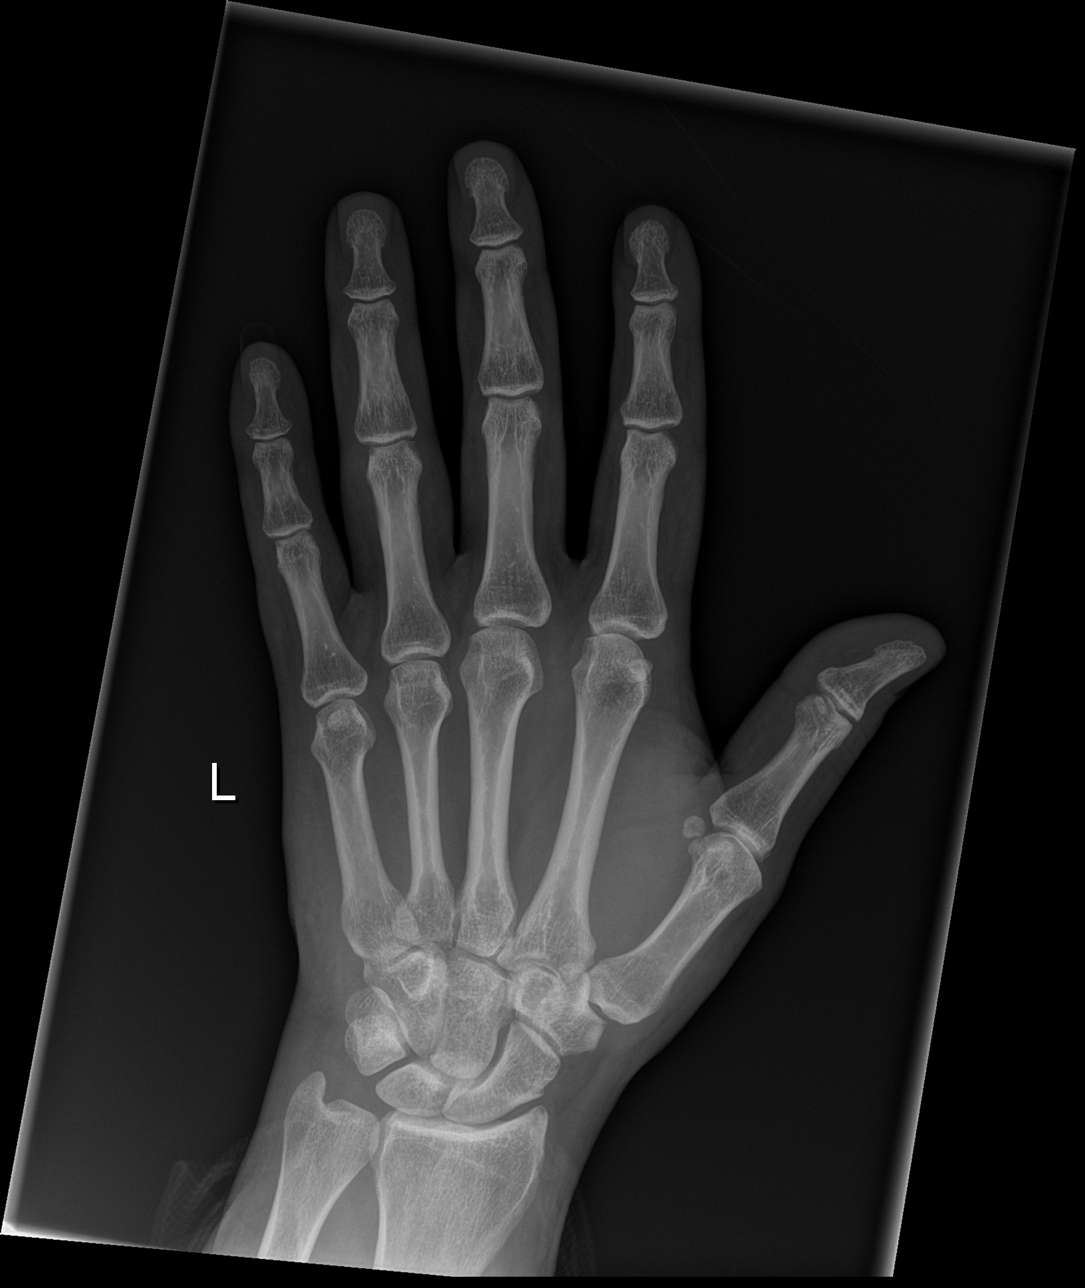

[x hand lat left]
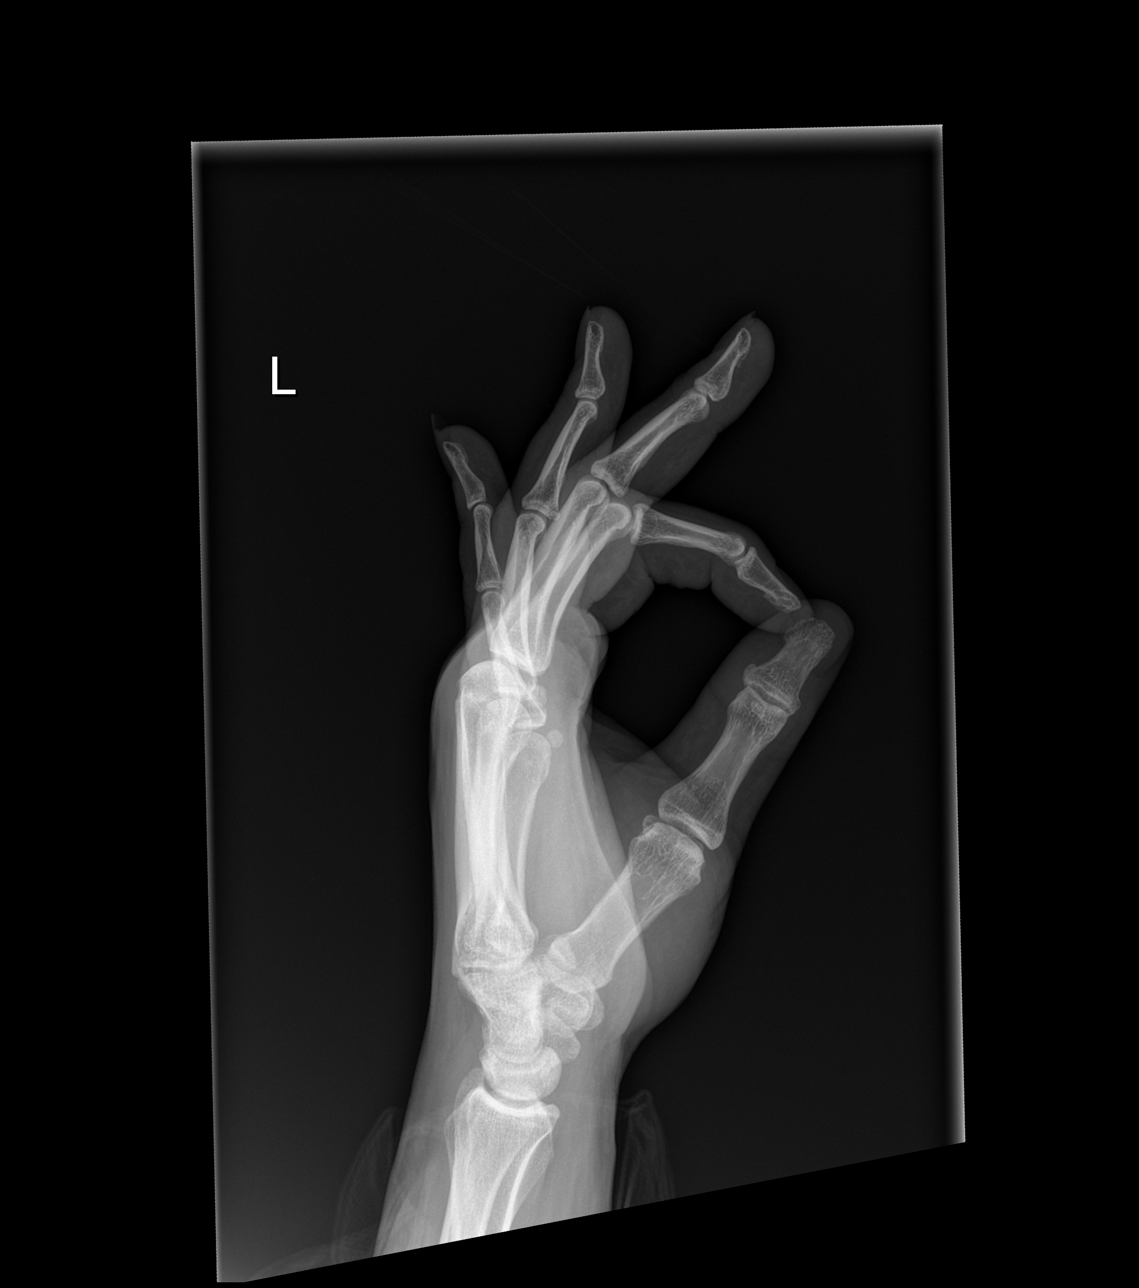

[3 of 3 positions shown; findings below may reference images not displayed]

FINDINGS: There is no evidence of fracture or dislocation. There is no
evidence of arthropathy or other focal bone abnormality. Soft
tissues are unremarkable.
IMPRESSION: Negative.

## 2022-03-31 MED ORDER — SODIUM CHLORIDE 0.9 % IV SOLN
1000.0000 mg | Freq: Once | INTRAVENOUS | Status: AC
  Administered 2022-03-31: 1000 mg via INTRAVENOUS
  Filled 2022-03-31 (×2): qty 16

## 2022-03-31 MED ORDER — ESCITALOPRAM OXALATE 10 MG PO TABS
10.0000 mg | ORAL_TABLET | Freq: Every day | ORAL | 2 refills | Status: AC
  Filled 2022-03-31: qty 30, 30d supply, fill #0

## 2022-03-31 NOTE — TOC Progression Note (Signed)
Transition of Care [facility]) - Progression Note    Patient Details  Name: Shane Ramirez MRN: [id number] Date of Birth: 24-Jul-1986  Transition of Care [facility]) CM/SW Contact  Geni Bers, RN Phone Number: 03/31/2022, 10:56 AM  Clinical Narrative:     2 Bus passes given to RN, one for transportation to shelter and the other for MD appointment.     Barriers to Discharge: Continued Medical Work up, Inadequate or no insurance  Expected Discharge Plan and Services           Expected Discharge Date: 03/31/22                                     Social Determinants of Health (SDOH) Interventions    Readmission Risk Interventions    03/30/2022    1:21 PM 03/29/2022    1:01 PM  Readmission Risk Prevention Plan  Post Dischage Appt Complete Complete  Medication Screening Complete Complete  Transportation Screening Complete Complete

## 2022-03-31 NOTE — ED Provider Notes (Signed)
[facility]  HOSPITAL-EMERGENCY DEPT Provider Note   CSN: [id number] Arrival date & time: 03/31/22  1447     History  Chief Complaint  Patient presents with   Generalized Body Aches         Jaiveon Suppes is a 36 y.o. male here presenting with generalized weakness.  Patient has a history of multiple sclerosis.  Patient has been in and out of the ED for the last week or so.  Patient was first admitted on 6/15.  Patient received 2 doses of Solu-Medrol for multiple sclerosis flareup.  Then signed out AGAINST MEDICAL ADVICE and came back several times.  Most recently patient signed out AGAINST MEDICAL ADVICE this morning.  Patient complains of persistent weakness.  Patient is also homeless.  Denies another fall prior to arrival  The history is provided by the patient.       Home Medications Prior to Admission medications   Medication Sig Start Date End Date Taking? Authorizing Provider  escitalopram (LEXAPRO) 10 MG tablet Take 1 tablet (10 mg total) by mouth daily. 03/31/22 03/31/23  Rolly Salter, MD      Allergies    Patient has no known allergies.    Review of Systems   Review of Systems  Neurological:  Positive for weakness.  All other systems reviewed and are negative.   Physical Exam Updated Vital Signs BP 138/68   Pulse 75   Temp 98 F (36.7 C) (Oral)   Ht 6\' 4"  (1.93 m)   Wt 102.1 kg   SpO2 98%   BMI 27.40 kg/m  Physical Exam Vitals and nursing note reviewed.  Constitutional:      Comments: Disheveled, chronically ill  HENT:     Head: Normocephalic.     Right Ear: Tympanic membrane normal.     Left Ear: Tympanic membrane normal.     Nose: Nose normal.     Mouth/Throat:     Mouth: Mucous membranes are dry.  Eyes:     Extraocular Movements: Extraocular movements intact.     Pupils: Pupils are equal, round, and reactive to light.  Cardiovascular:     Rate and Rhythm: Normal rate and regular rhythm.     Pulses: Normal pulses.     Heart  sounds: Normal heart sounds.  Pulmonary:     Effort: Pulmonary effort is normal.     Breath sounds: Normal breath sounds.  Abdominal:     General: Abdomen is flat.     Palpations: Abdomen is soft.  Musculoskeletal:        General: Normal range of motion.     Cervical back: Normal range of motion and neck supple.  Skin:    General: Skin is warm.     Capillary Refill: Capillary refill takes less than 2 seconds.  Neurological:     Comments: Cranial nerves II to XII is intact.  Patient is able to walk with a walker.  Normal reflexes bilateral knees  Psychiatric:        Mood and Affect: Mood normal.        Behavior: Behavior normal.     ED Results / Procedures / Treatments   Labs (all labs ordered are listed, but only abnormal results are displayed) Labs Reviewed  CBC WITH DIFFERENTIAL/PLATELET  COMPREHENSIVE METABOLIC PANEL    EKG None  Radiology DG Hand Complete Left  Result Date: 03/31/2022 CLINICAL DATA:  injury/pain EXAM: LEFT HAND - COMPLETE 3+ VIEW COMPARISON:  None Available. FINDINGS: There is no  evidence of fracture or dislocation. There is no evidence of arthropathy or other focal bone abnormality. Soft tissues are unremarkable. IMPRESSION: Negative. Electronically Signed   By: Tish Frederickson M.D.   On: 03/31/2022 15:32    Procedures Procedures    Medications Ordered in ED Medications  methylPREDNISolone sodium succinate (SOLU-MEDROL) 1,000 mg in sodium chloride 0.9 % 50 mL IVPB (has no administration in time range)    ED Course/ Medical Decision Making/ A&P                           Medical Decision Making Satchel Heidinger is a 36 y.o. male here presenting with weakness.  Patient has history of multiple sclerosis.  He has recent MRI was and diagnostic.  He was seen by neurology recently and recommend Solu-Medrol 1 g x 5 doses.  I discussed case with Dr. Iver Nestle from neurology.  She saw patient several days ago.  She states that at this point, patient received 2  doses of Solu-Medrol. She recommend another dose and if it does not improve his symptoms, she does not recommend further doses.  She states that the Solu-Medrol is 3-5 doses altogether but since he signed out AGAINST MEDICAL ADVICE frequently, she felt that 3 doses is enough.  If after 3 doses, he still is weak, she does not recommend another regimen for 1 month.  PA was able to put an IV.  We will give Solu-Medrol.  We will also contact social work  5:56 PM Labs unremarkable.  Patient is ambulatory.  I tried to find him a shelter but not successful.  Patient wants to go home.  Stable for discharge.   Problems Addressed: Multiple sclerosis (HCC): chronic illness or injury Weakness: acute illness or injury  Amount and/or Complexity of Data Reviewed Labs: ordered. Decision-making details documented in ED Course.    Final Clinical Impression(s) / ED Diagnoses Final diagnoses:  None    Rx / DC Orders ED Discharge Orders     None         Charlynne Pander, MD 03/31/22 1758

## 2022-03-31 NOTE — ED Notes (Signed)
Pt ambulated approximately 40 feet with walker, pt tolerated well with a slight steady shuffle noted.

## 2022-03-31 NOTE — Progress Notes (Signed)
TRH floor coverage for both MC and [facility] (remote) on night of 03/30/22 into morning of 03/31/22:    I was notified by RN that peripheral access on this patient has been lost, and that the patient is refusing reestablishment of peripheral IV access at this time.  He is here with acute MS flare, and is currently ordered Solu-Medrol 1 g IV daily x5-day course.  He is due for his next dose of IV solumedrol this evening (03/30/22), but is also refusing this medication at the present, as it would require reestablishment of peripheral IV access.  I touch base with the on-call inpatient pharmacist to assess equivalent dose of prednisone and evaluating for any reasonable oral steroid alternative for this evening.  However, equivalent dose of prednisone is greater than 1200 mg.  Consequently, per the patient's request, we will refrain from dose of IV solumedrol this evening, and also refrain from dose of prednisone in  excess of 1 g.      Newton Pigg, DO Hospitalist

## 2022-03-31 NOTE — Discharge Summary (Addendum)
Physician Discharge Summary   Patient: Shane Ramirez MRN: [id number] DOB: Jan 16, 1986  Admit date:     03/29/2022  Discharge date: 03/31/2022  Discharge Physician: Lynden Oxford  PCP: Pcp, No  Recommendations at discharge: Follow-up with PCP on 04/02/2022.  Patient can receive medicines from the clinic. Follow-up with neurology Dr. Epimenio Foot as per my discussion with neurology Dr. Selina Cooley.  Discharge Diagnoses: Principal Problem:   Multiple sclerosis exacerbation (HCC) Active Problems:   Recurrent falls   Homeless  Hospital Course: Past medical history significant for MS not on any medication.  Originally from [place] and now from [place]. Diagnosed with MS in 2022.  Never started on disease modifying therapy.   Moved to [place] trying to find his brother. Admitted on 6/10 for MS flareup started on IV Solu-Medrol after initial work-up. Left AMA on 6/15 after receiving 2 doses of IV Solu-Medrol. Came back to ER, left AMA from ER on 6/15. Brought back to ER again on 6/15 and now admitted.  Assessment and Plan: Multiple sclerosis flareup. Recurrent fall. Patient tells me that he is only able to stand for 2 to 3 minutes at his baseline. Patient told me that from [place], somebody bought him a ticket to get on the bus and was just admitted to the hospital ever since he has arrived to [place]. Patient does not see any significant difference from his baseline in terms of any deficit. MRI brain and MRI C-spine without contrast shows evidence of T2 weighted hyperintense imaging concerning for possible flareup. Neurology was consulted last admission and recommended 5 days of IV Solu-Medrol 1 g daily. Patient completed 2 2 doses of IV Solu-Medrol 1 g.  Then left AMA. Came back to the ER received 1 more dose of IV Solu-Medrol. Thus so far he has received 3 doses of IV Solu-Medrol. Patient removed his IV access again and refused for new IV access. Discussed with neurology and felt that 3  doses will be adequate and patient can be discharged home. Neurology has specifically arranged close follow-up with Dr. Epimenio Foot who is our MS specialist for treatment for the patient outpatient. We have arranged  wellness clinic follow-up appointment on 6/19. Patient will be provided assistance by the case manager. Unable to provide any living assistance to the patient per Child psychotherapist.   Homeless Social worker consulted. Patient will be provided resources for local shelter.  Unable to further provide any assistance per Child psychotherapist.   Diarrhea. Patient reported some diarrhea prior to this admission. Currently resolved.  Depression. On questioning patient tells me that he has lost his father recently and is in search for his brother and feels depressed. I have offered to initiate medicine for depression and patient has agreed. We will prescribe Lexapro to the [facility] wellness clinic. Patient clearly denies any suicidal ideation.  Noncompliance. Patient has multiple instances of noncompliance  left AMA twice so far from the hospital. Puts out his IV multiple times and refuses to allow [place] to put a new IV. Does not allow me to examine him today on 6/17. Wants to go out of the hospital. Patient appears to have understanding of his medical condition and also appears to have understanding of consequences of not taking good medical care. It appears that the patient does have capacity to make his decision with regards to his health care. Patient does not appear to be suffering from any acute psychosis or encephalopathy like event. Patient refuses care and wants to be discharged.  Consultants:  Discussed with neurology Procedures performed:  None DISCHARGE MEDICATION: Allergies as of 03/31/2022   No Known Allergies      Medication List     TAKE these medications    escitalopram 10 MG tablet Commonly known as: Lexapro Take 1 tablet (10 mg total) by mouth  daily.        Follow-up Information     Sater, Pearletha Furl, MD. Schedule an appointment as soon as possible for a visit in 1 week(s).   Specialty: Neurology Contact information: [address] [place] [address] [phone]         [facility] Follow up in 2 day(s).   Why: Please keep this appointment Monday April 02, 2022 at 3:30 PM. You may get your medication here. Contact information: [address] [place] [address] [phone]               Disposition: Homeless Diet recommendation: Regular diet  Discharge Exam: Vitals:   03/30/22 1254 03/30/22 1640 03/30/22 2002 03/31/22 0548  BP: (!) 170/67 (!) 143/71 115/60 126/62  Pulse: 87 81 82 (!) 58  Resp: 16 16 (!) 22 18  Temp: 98.2 F (36.8 C) 97.8 F (36.6 C) 98.1 F (36.7 C) 98.4 F (36.9 C)  TempSrc: Oral Oral Oral Oral  SpO2: 100% 100% 97% 100%  Weight:      Height:       Patient refused examination.  Filed Weights   03/29/22 2023  Weight: 102.1 kg   Condition at discharge: stable  The results of significant diagnostics from this hospitalization (including imaging, microbiology, ancillary and laboratory) are listed below for reference.   Imaging Studies: MR CERVICAL SPINE W WO CONTRAST  Result Date: 03/28/2022 CLINICAL DATA:  Follow-up examination for multiple sclerosis, myelopathy. EXAM: MRI CERVICAL SPINE WITHOUT AND WITH CONTRAST TECHNIQUE: Multiplanar and multiecho pulse sequences of the cervical spine, to include the craniocervical junction and cervicothoracic junction, were obtained without and with intravenous contrast. CONTRAST:  74mL GADAVIST GADOBUTROL 1 MMOL/ML IV SOLN COMPARISON:  Prior study from 03/25/2022. FINDINGS: Alignment: Examination is moderately to severely degraded by motion artifact, limiting assessment. Vertebral bodies normally aligned with preservation of the normal cervical lordosis. No listhesis. Vertebrae:  Vertebral body height maintained without acute or chronic fracture. Bone marrow signal intensity within normal limits. No discrete or worrisome osseous lesions. No abnormal marrow edema or enhancement. Cord: Evaluation of the cervical spinal cord limited by motion. No obvious cord signal changes to suggest demyelinating disease. No abnormal enhancement. Posterior Fossa, vertebral arteries, paraspinal tissues: Paraspinous soft tissues grossly within normal limits. Disc levels: No more than mild multilevel noncompressive disc bulging noted within the cervical spine. No significant spinal stenosis. Evaluation for foraminal encroachment limited by motion. IMPRESSION: 1. Technically limited exam due to extensive motion artifact. No obvious cord signal changes to suggest demyelinating disease. No abnormal enhancement. 2. Mild multilevel cervical spondylosis without significant stenosis. Electronically Signed   By: Rise Mu M.D.   On: 03/28/2022 20:05   MR BRAIN W WO CONTRAST  Result Date: 03/28/2022 CLINICAL DATA:  Follow-up examination for multiple sclerosis, lower extremity weakness. EXAM: MRI HEAD WITHOUT AND WITH CONTRAST TECHNIQUE: Multiplanar, multiecho pulse sequences of the brain and surrounding structures were obtained without and with intravenous contrast. CONTRAST:  68mL GADAVIST GADOBUTROL 1 MMOL/ML IV SOLN COMPARISON:  Prior brain MRI from 03/25/2022. FINDINGS: Brain: Current examination has been performed with a mildly improved degree of motion as compared to  previous, however, examination remains fairly limited and motion degraded. Mildly advanced cerebral atrophy for age. Patchy and confluent T2/FLAIR hyperintensities involving the periventricular, deep, and juxta cortical white matter of both cerebral hemispheres, consistent with history of multiple sclerosis. The largest lesion again noted at the posterior left periventricular white matter and measures up to approximately 2.2 cm.  Associated incomplete ring of diffusion signal abnormality about this lesion is seen (series 6, image 79), similar to previous. Probable associated subtle ADC correlate noted as well (series 7, image 29). Although no clear associated enhancement seen on the motion degraded postcontrast sequences, overall appearance of the diffusion signal change suspected to reflect a degree of associated active demyelination. No other clear enhancing lesions or evidence for active demyelination elsewhere within the brain. No evidence for acute or subacute infarct. No areas of chronic cortical infarction. No acute or chronic intracranial blood products. No mass lesion, mass effect or midline shift. No hydrocephalus or extra-axial fluid collection. Pituitary gland suprasellar region normal. Midline structures intact and normally formed. No other appreciable abnormal enhancement. Vascular: Major intracranial vascular flow voids are maintained at the skull base. Skull and upper cervical spine: Craniocervical junction within normal limits. Bone marrow signal intensity grossly normal without focal marrow replacing lesion. No scalp soft tissue abnormality. Sinuses/Orbits: Globes and orbital soft tissues within normal limits. Mild scattered mucosal thickening noted about the ethmoidal air cells and maxillary sinuses. No mastoid effusion. Other: None. IMPRESSION: 1. Technically limited exam due to extension motion artifact. 2. Patchy and confluent T2/FLAIR hyperintensities involving the supratentorial cerebral white matter, consistent with provided history of multiple sclerosis. Associated incomplete ring of diffusion signal abnormality about a dominant lesion at the left parietal periventricular white matter suspected to reflect a degree of active demyelination. No other definite or convincing evidence for active demyelination elsewhere on this motion degraded exam. Electronically Signed   By: Rise Mu M.D.   On: 03/28/2022  19:45   MR THORACIC SPINE W WO CONTRAST  Result Date: 03/26/2022 CLINICAL DATA:  Provided history: Multiple sclerosis. Additional history provided: Patient diagnosed with multiple sclerosis in 2022, several falls. EXAM: MRI THORACIC WITHOUT AND WITH CONTRAST TECHNIQUE: Multiplanar and multiecho pulse sequences of the thoracic spine were obtained without and with intravenous contrast. CONTRAST:  10mL GADAVIST GADOBUTROL 1 MMOL/ML IV SOLN COMPARISON:  MRI brain 03/25/2022. MRI cervical spine 03/25/2022. FINDINGS: The examination is significantly motion degraded, limiting evaluation. Most notably, there is moderate motion degradation of the sagittal T2 TSE sequence, moderate motion degradation of the sagittal STIR sequence, severe motion degradation of the axial T2 TSE sequence, severe motion degradation of the axial T2 GRE sequence and moderate motion degradation of the sagittal T1 weighted post-contrast fat saturated sequence. Alignment:  No significant spondylolisthesis. Vertebrae: Mild chronic anterior wedge deformity of the T12 vertebral body. Vertebral body height is otherwise maintained. Multilevel degenerative endplate irregularity. No significant marrow edema or focal suspicious osseous lesion. Cord: Suspected short-segment focus of T2 hyperintense signal abnormality within the left aspect of the spinal cord at the T8-T9 level (for instance as seen on series 18, image 10) (series 20, image 25). No definite signal abnormality identified elsewhere within the thoracic spinal cord, although motion degradation significantly limits evaluation. No appreciable pathologic spinal cord enhancement. Paraspinal and other soft tissues: Nonspecific 16 mm peripherally enhancing, cystic, cutaneous/subcutaneous lesion within the midline back at the T5 level (series 24, image 10) (series 18, image 9). Disc levels: Multilevel disc degeneration. Disc degeneration is greatest at T4-T5 and T5-T6 (  moderate at these levels).  Multilevel disc bulges. At T8-T9, a small right center disc protrusion focally effaces the ventral thecal sac, contacting and minimally flattening the right ventral aspect of the spinal cord. However, the dorsal CSF space is maintained within the spinal canal. No significant spinal canal stenosis at the remaining levels. No significant foraminal stenosis. IMPRESSION: Significantly motion degraded examination, as described and limiting evaluation. Suspected short-segment focus of T2 hyperintense signal abnormality within the left aspect of the spinal cord at the T8-T9 level, likely reflecting a demyelinating lesion given the provided history. No lesions are identified elsewhere within the thoracic spinal cord, although motion degradation significantly limits evaluation. No appreciable pathologic spinal cord enhancement. Thoracic spondylosis, as described. Most notably at T8-T9, a small right center disc protrusion focally effaces the ventral thecal sac, contacting and minimally flattening the ventral aspect of the spinal cord. However, the dorsal CSF space is maintained within the spinal canal. Mild chronic T12 anterior wedge vertebral compression deformity. Nonspecific 16 mm cystic-appearing cutaneous/subcutaneous within the midline back at the T5 level. Direct visualization is recommended. Electronically Signed   By: Jackey Loge D.O.   On: 03/26/2022 15:29   MR BRAIN WO CONTRAST  Result Date: 03/25/2022 CLINICAL DATA:  Multiple sclerosis EXAM: MRI HEAD WITHOUT CONTRAST MRI CERVICAL SPINE WITHOUT CONTRAST TECHNIQUE: Multiplanar, multiecho pulse sequences of the brain and surrounding structures, and cervical spine, to include the craniocervical junction and cervicothoracic junction, were obtained without intravenous contrast. COMPARISON:  None Available. FINDINGS: Examination is severely degraded by motion. The was terminated prior to completion due to patient inability to cooperate with the technologist's  instructions. MRI HEAD FINDINGS There is a large hyperintense T2-weighted signal lesion within the left parietal periatrial white matter. Otherwise, imaging quality is too degraded to give much information. MRI CERVICAL SPINE FINDINGS There is no spinal canal stenosis. Cord parenchyma cannot be assessed due to motion. IMPRESSION: 1. Severely motion degraded and truncated examination. 2. Large hyperintense T2-weighted signal lesion within the left parietal periatrial white matter. This may indicate a demyelinating lesion or other inflammatory process. 3. No spinal canal stenosis. Cord parenchyma cannot be assessed due to motion. Electronically Signed   By: Deatra Robinson M.D.   On: 03/25/2022 20:26   MR CERVICAL SPINE WO CONTRAST  Result Date: 03/25/2022 CLINICAL DATA:  Multiple sclerosis EXAM: MRI HEAD WITHOUT CONTRAST MRI CERVICAL SPINE WITHOUT CONTRAST TECHNIQUE: Multiplanar, multiecho pulse sequences of the brain and surrounding structures, and cervical spine, to include the craniocervical junction and cervicothoracic junction, were obtained without intravenous contrast. COMPARISON:  None Available. FINDINGS: Examination is severely degraded by motion. The was terminated prior to completion due to patient inability to cooperate with the technologist's instructions. MRI HEAD FINDINGS There is a large hyperintense T2-weighted signal lesion within the left parietal periatrial white matter. Otherwise, imaging quality is too degraded to give much information. MRI CERVICAL SPINE FINDINGS There is no spinal canal stenosis. Cord parenchyma cannot be assessed due to motion. IMPRESSION: 1. Severely motion degraded and truncated examination. 2. Large hyperintense T2-weighted signal lesion within the left parietal periatrial white matter. This may indicate a demyelinating lesion or other inflammatory process. 3. No spinal canal stenosis. Cord parenchyma cannot be assessed due to motion. Electronically Signed   By: Deatra Robinson M.D.   On: 03/25/2022 20:26   DG Ribs Unilateral W/Chest Left  Result Date: 03/24/2022 CLINICAL DATA:  Fall.  Left chest wall pain. EXAM: LEFT RIBS AND CHEST - 3+ VIEW COMPARISON:  None  Available. FINDINGS: The lungs are clear without focal pneumonia, edema, pneumothorax or pleural effusion. The cardiopericardial silhouette is within normal limits for size. Oblique views of the left ribs show no evidence for an acute displaced left-sided rib fracture IMPRESSION: Negative. Electronically Signed   By: Kennith Center M.D.   On: 03/24/2022 10:46    Microbiology: No results found for this or any previous visit.  Labs: CBC: Recent Labs  Lab 03/25/22 0521 03/29/22 0451 03/31/22 0446  WBC 5.5 22.1* 14.2*  NEUTROABS  --   --  9.5*  HGB 12.7* 15.2 12.8*  HCT 38.1* 45.4 38.7*  MCV 89.0 88.3 88.8  PLT 161 244 207   Basic Metabolic Panel: Recent Labs  Lab 03/25/22 0521 03/29/22 0451 03/31/22 0446  NA 142 142 140  K 3.9 3.9 3.7  CL 110 109 110  CO2 25 26 27   GLUCOSE 91 135* 87  BUN 17 25* 34*  CREATININE 0.79 0.80 0.85  CALCIUM 8.6* 9.6 8.3*  MG 2.0  --  2.3   Liver Function Tests: Recent Labs  Lab 03/31/22 0446  AST 18  ALT 37  ALKPHOS 51  BILITOT 0.5  PROT 5.2*  ALBUMIN 2.8*   CBG: Recent Labs  Lab 03/29/22 1206 03/30/22 0921 03/30/22 1234 03/30/22 1638 03/30/22 2133  GLUCAP 172* 133* 223* 154* 133*    Discharge time spent: greater than 30 minutes.  Signed: 2134, MD [facility] 03/31/2022

## 2022-03-31 NOTE — ED Provider Notes (Signed)
Ultrasound ED Peripheral IV (Provider)  Date/Time: 03/31/2022 4:03 PM  Performed by: Gailen Shelter, PA Authorized by: Gailen Shelter, PA   Procedure details:    Skin Prep: chlorhexidine gluconate     Location: Above R AC.   Angiocath:  20 G   Bedside Ultrasound Guided: Yes     Images: not archived     Patient tolerated procedure without complications: Yes     Dressing applied: Yes   Comments:     1 attempt     Gailen Shelter, PA 03/31/22 1603    Charlynne Pander, MD 03/31/22 [phone]

## 2022-03-31 NOTE — ED Triage Notes (Signed)
Patient arrived with EMS from street ( homeless) reports bilateral legs numbness/tingling today , ambulatory , history of MS , denies leg injury or fever.

## 2022-03-31 NOTE — Discharge Instructions (Signed)
Please go to shelters.  You were given 3 doses of Solu-Medrol already.  Please follow-up with neurology.  Return to ER if you have worse weakness, falls

## 2022-03-31 NOTE — ED Triage Notes (Signed)
Pt BIB EMS c/o bilateral leg pain. Pt left ama at 1332

## 2022-03-31 NOTE — Progress Notes (Signed)
Assessment unchanged. Verbalized understanding of dc instructions given. Bus pass x 2 given to pt as provided by SW. AC up with clothes for pt to dc. Assisting pt to wc for dc, as he sat down he got his lt hand stuck under the seat and sat directly on it wedging it between the seat and wc. Assisted pt to stand and free his hand. Noted lt middle and lt ring finger were indented, no broken skin nor bleeding, but pt expressed pain. AC, Shane Ramirez, said she would stop by ED to have MD look at it. Reassurance given.

## 2022-03-31 NOTE — ED Notes (Signed)
Pt observed very upset about being seen in the ED. He attempted to elope without finishing treatment. MD made aware, discharge orders to follow.

## 2022-03-31 NOTE — ED Triage Notes (Signed)
Patient was brought down by 5E staff. Patient was being discharged from inpatient stay today, left fingers got caught in wheelchair. Patient reports pain in left fingers/hand. Fingers on left hand display broken skin around knuckle area. Pain 5/10

## 2022-03-31 NOTE — ED Notes (Signed)
Pt wheeling himself out of his room. This RN asked where he was going pt stated "I'm leaving." I explained to pt that if he left it would be against medical advise. Pt stated "okay" and continued to wheel himself out to the lobby. Dartha Lodge, PA-C made aware that pt was wheeling himself out and that pt stated okay in response to telling him that if he leaves it will be against medical advise. Charge RN Abby made aware.

## 2022-04-01 ENCOUNTER — Encounter (HOSPITAL_COMMUNITY): Payer: Self-pay

## 2022-04-01 ENCOUNTER — Other Ambulatory Visit: Payer: Self-pay

## 2022-04-01 ENCOUNTER — Emergency Department (HOSPITAL_COMMUNITY)
Admission: EM | Admit: 2022-04-01 | Discharge: 2022-04-01 | Discharge status: Against Medical Advice | Payer: Self-pay | Attending: Emergency Medicine | Admitting: Emergency Medicine

## 2022-04-01 ENCOUNTER — Emergency Department (HOSPITAL_COMMUNITY)
Admission: EM | Admit: 2022-04-01 | Discharge: 2022-04-02 | Disposition: A | Discharge status: Home / Self Care | Payer: Self-pay | Attending: Emergency Medicine | Admitting: Emergency Medicine

## 2022-04-01 DIAGNOSIS — R200 Anesthesia of skin: Secondary | ICD-10-CM | POA: Insufficient documentation

## 2022-04-01 DIAGNOSIS — Z5321 Procedure and treatment not carried out due to patient leaving prior to being seen by health care provider: Secondary | ICD-10-CM | POA: Insufficient documentation

## 2022-04-01 NOTE — Discharge Instructions (Signed)
If you are in need of a shelter please call [facility] ([facility]) at [phone] between the hours of 9am-5pm Mon-Fri.  [facility] will contact all the local shelters to find openings.  Right now they are requiring people to quarantine at a hotel before they can go to an open bed but [facility] may be able to set that up as well.  They are not open on weekends.  On Monday-Friday morning at 8 am until 1 pm, you can also go to the [facility] (see below) to seek shelter in the Hotel Quarantine Program prior to entering a shelter. You can also call the number provided (see the above paragraph) to seek placement into the program by calling [facility].   [facility] [address] [place], [place] [address] Phone: [phone] Fax: [phone]  For Free Breakfasts and Lunches 7 days a week you can go to: [facility] [address], [place], [place] [address] Hours: Open today  8AM-5PM Phone: [phone] Breakfast: 6:30-7:30 am Lunch served: 10:40 am - 12:40pm         [facility] M-F 8am-3pm   [address] [place], [place] [address]   [phone] Services include: laundry, barbering, support groups, case management, phone  & computer access, showers, AA/NA mtgs, mental health/substance abuse nurse, job skills class, disability information, VA assistance, spiritual classes, etc.   [facility] [address] [place], [place]   [phone] Provides breakfast each weekday morning except Wednesdays, and an evening community meal every Friday. Access to showers is available during breakfast hours and telephones for seeking work are also provided. Also offers job referral and counseling for the homeless and unemployed.  [facility]LTER[facility]   [facility] [address]     [facility] [place], [place] [address]     [address], [place]  [phone]      [phone]  [facility]   [facility] [address]    [address] [place] [address]     [place], [place] [address] [phone]       [phone]  [facility] (women only) [address] [place], [place] [address] [phone]  [facility]: [place] (overflow shelter: [address] [place], [place] check in at 6pm for placement in local shelter).  [address], [place], [place] [address] Phone: [phone]  Crisis Services [facility]     [facility]   Crisis Services      [facility] [phone]      [phone] [address]     [address] [place], [place] [address]     [place], [place] [address]    [facility] - [phone]  

## 2022-04-01 NOTE — ED Provider Notes (Signed)
  Physical Exam  BP 108/72   Pulse (!) 42   Temp 97.8 F (36.6 C) (Oral)   Resp 16   SpO2 100%   Physical Exam  Procedures  Procedures  ED Course / MDM    Medical Decision Making Amount and/or Complexity of Data Reviewed Labs: ordered.   Received patient in signout.  History of MS.  Also psychiatric history.  Recently treated for the MS.  Also homeless issues.  Has been seen by social work both today and previously.  Given resources.  Hopefully [facility] can help with the overall picture.  However does not require admission to the hospital will discharge at this time.       Benjiman Core, MD 04/01/22 1119

## 2022-04-01 NOTE — ED Provider Notes (Signed)
[facility] EMERGENCY DEPARTMENT Provider Note   CSN: [id number] Arrival date & time: 03/31/22  2201     History  No chief complaint on file.   Shane Ramirez is a 36 y.o. male.  HPI     This is a 36 year old male who is currently homeless who presents with ongoing numbness of his bilateral lower extremities.  This is his ninth visit since 6/6.  Prior to that he was new to our system.  Does have a history of MS.  He was admitted and left AGAINST MEDICAL ADVICE twice.  He has received a total of 3 doses of Solu-Medrol.  He was last seen in the emergency room yesterday.  At that time he was given a third dose of Solu-Medrol and per documentation, neurology did not recommend any further Solu-Medrol dosing for his MS.  Patient returns today with persistent numbness in his legs.  He states it makes it difficult for him to walk.  At baseline he walks with a walker.  He states he is unable to get into a shelter because of his MS and "they will not assign me a bunk."  He denies any new symptoms or worsening of symptoms.  Patient is fairly uncooperative with questioning and give short, curt answers.  Home Medications Prior to Admission medications   Medication Sig Start Date End Date Taking? Authorizing Provider  escitalopram (LEXAPRO) 10 MG tablet Take 1 tablet (10 mg total) by mouth daily. 03/31/22 03/31/23  Rolly Salter, MD      Allergies    Patient has no known allergies.    Review of Systems   Review of Systems  Constitutional:  Negative for fever.  Respiratory:  Negative for shortness of breath.   Cardiovascular:  Negative for chest pain.  Neurological:  Positive for weakness and numbness.  All other systems reviewed and are negative.   Physical Exam Updated Vital Signs BP 119/65 (BP Location: Left Arm)   Pulse 62   Temp 98.1 F (36.7 C) (Oral)   Resp 16   SpO2 97%  Physical Exam Vitals and nursing note reviewed.  Constitutional:      Appearance: He is  well-developed.     Comments: Appears older than stated age  HENT:     Head: Normocephalic and atraumatic.     Mouth/Throat:     Mouth: Mucous membranes are moist.  Eyes:     Pupils: Pupils are equal, round, and reactive to light.  Cardiovascular:     Rate and Rhythm: Normal rate and regular rhythm.     Heart sounds: Normal heart sounds.  Pulmonary:     Effort: Pulmonary effort is normal. No respiratory distress.     Breath sounds: Normal breath sounds.  Abdominal:     Palpations: Abdomen is soft.     Tenderness: There is no abdominal tenderness.  Musculoskeletal:        General: No deformity.     Cervical back: Neck supple.  Lymphadenopathy:     Cervical: No cervical adenopathy.  Skin:    General: Skin is warm and dry.  Neurological:     Mental Status: He is alert and oriented to person, place, and time.     Comments: Cranial nerves II through XII intact, 4+ out of 5 strength bilateral lower extremities with proximal muscles, 5 out of 5 strength with plantar and dorsiflexion of the foot, 2+ patellar reflexes bilaterally  Psychiatric:     Comments: Flat, withdrawn  ED Results / Procedures / Treatments   Labs (all labs ordered are listed, but only abnormal results are displayed) Labs Reviewed  CBC WITH DIFFERENTIAL/PLATELET - Abnormal; Notable for the following components:      Result Value   Abs Immature Granulocytes 0.16 (*)    All other components within normal limits  BASIC METABOLIC PANEL - Abnormal; Notable for the following components:   Glucose, Bld 134 (*)    BUN 27 (*)    Calcium 8.7 (*)    All other components within normal limits    EKG None  Radiology DG Hand Complete Left  Result Date: 03/31/2022 CLINICAL DATA:  injury/pain EXAM: LEFT HAND - COMPLETE 3+ VIEW COMPARISON:  None Available. FINDINGS: There is no evidence of fracture or dislocation. There is no evidence of arthropathy or other focal bone abnormality. Soft tissues are unremarkable.  IMPRESSION: Negative. Electronically Signed   By: Tish Frederickson M.D.   On: 03/31/2022 15:32    Procedures Procedures    Medications Ordered in ED Medications - No data to display  ED Course/ Medical Decision Making/ A&P                           Medical Decision Making Amount and/or Complexity of Data Reviewed Labs: ordered.   This patient presents to the ED for concern of numbness, this involves an extensive number of treatment options, and is a complaint that carries with it a high risk of complications and morbidity.  I considered the following differential and admission for this acute, potentially life threatening condition.  The differential diagnosis includes chronic MS symptoms, acute flare, CVA  MDM:    This is a 36 year old male with known MS who presents with persistent numbness.  Really denies any new symptoms today.  This is his ninth ED visit since 6/6.  He has received a total of 3 doses of Solu-Medrol for presumed flare.  Neurology not recommending any further.  He is difficult to obtain a history from but it does not appear that he has any new symptoms.  He does state that he has not had a place to stay and cannot get into shelter.  Do not feel he needs any further work-up as he has recently had work-up as early as yesterday afternoon.  I reviewed that work-up and it was reassuring.  Will hold for our case manager to evaluate.  Maybe they can assist with finding him a shelter as he states that he is being turned away because of his MS.  (Labs, imaging, consults)  Labs: I Ordered, and personally interpreted labs.  The pertinent results include: None  Imaging Studies ordered: I ordered imaging studies including none I independently visualized and interpreted imaging. I agree with the radiologist interpretation  Additional history obtained from chart review.  External records from outside source obtained and reviewed including discharge summary, neurology  notes  Cardiac Monitoring: The patient was maintained on a cardiac monitor.  I personally viewed and interpreted the cardiac monitored which showed an underlying rhythm of: Normal sinus rhythm  Reevaluation: After the interventions noted above, I reevaluated the patient and found that they have :stayed the same  Social Determinants of Health: Homeless  Disposition: Awaiting TOC  Co morbidities that complicate the patient evaluation  Past Medical History:  Diagnosis Date   Multiple sclerosis (HCC)      Medicines No orders of the defined types were placed in this encounter.  I have reviewed the patients home medicines and have made adjustments as needed  Problem List / ED Course: Problem List Items Addressed This Visit   None Visit Diagnoses     Numbness    -  Primary                   Final Clinical Impression(s) / ED Diagnoses Final diagnoses:  None    Rx / DC Orders ED Discharge Orders     None         Shon Baton, MD 04/01/22 [phone]

## 2022-04-01 NOTE — ED Notes (Signed)
The pt reports that he has had numb ness in both legs for one years  he reports that he does not have a doctor.  Weird affect as I placed the bp cuff on his arm   he did not appear to be able to move it.  After a few minutes he lifted the lt arm with no difficulty

## 2022-04-01 NOTE — ED Triage Notes (Signed)
Patient arrived by [facility] after [place] [address] again today after meeting with SW and being discharged.

## 2022-04-01 NOTE — ED Triage Notes (Signed)
Pt comes via [facility] for leg pain, hx of MS, seen yesterday for the same

## 2022-04-01 NOTE — TOC Progression Note (Signed)
Transition of Care [facility]) - Progression Note    Patient Details  Name: Shane Ramirez MRN: [id number] Date of Birth: [birth year]  Transition of Care [facility]) CM/SW Seth Ward, [place] Phone Number: 04/01/2022, 9:25 AM  Clinical Narrative:    CSW received consult regarding homelessness. CSW notes prior [facility] staff addressed on 6/17. 6/16, 6/15. Unfortunately as today is a weekend there is limited resources available. CSW met with patient and discussed plan to go to [facility] to contact and follow-up with [facility] for more long-term housing supports. CSW noted patient participated minimally in conversation and verbally confirmed understanding. CSW noted patient did not talk much. CSW informed MD of resource limitations regarding patient's case.        Expected Discharge Plan and Services                                                 Social Determinants of Health (SDOH) Interventions    Readmission Risk Interventions    03/30/2022    1:21 PM 03/29/2022    1:01 PM  Readmission Risk Prevention Plan  Post Dischage Appt Complete Complete  Medication Screening Complete Complete  Transportation Screening Complete Complete

## 2022-04-02 ENCOUNTER — Inpatient Hospital Stay: Payer: Self-pay | Admitting: Nurse Practitioner

## 2022-04-02 ENCOUNTER — Other Ambulatory Visit (HOSPITAL_BASED_OUTPATIENT_CLINIC_OR_DEPARTMENT_OTHER): Payer: Self-pay

## 2022-04-02 NOTE — ED Provider Notes (Signed)
[facility] EMERGENCY DEPARTMENT Provider Note   CSN: [id number] Arrival date & time: 04/01/22  2205     History  Chief Complaint  Patient presents with   Leg Pain    Shane Ramirez is a 36 y.o. male.  Patient with reported history of MS, homelessness presents to the emergency department for evaluation of leg numbness.  Patient has been seen in the emergency department multiple different times this month for the same symptoms.  He has been admitted to the hospital and received IV steroids, left AMA.  He was then cleared from neurology standpoint during later ED visits.  Patient denies new weakness, only states that he cannot feel his legs.  He is ambulatory with a walker.  He states that there is no changes from previous baseline.  Also asking for a sandwich.  Denies any current medical complaints otherwise.       Home Medications Prior to Admission medications   Medication Sig Start Date End Date Taking? Authorizing Provider  escitalopram (LEXAPRO) 10 MG tablet Take 1 tablet (10 mg total) by mouth daily. 03/31/22 03/31/23  Rolly Salter, MD      Allergies    Patient has no known allergies.    Review of Systems   Review of Systems  Physical Exam Updated Vital Signs BP 133/82 (BP Location: Left Arm)   Pulse 60   Temp 97.8 F (36.6 C) (Oral)   Resp 15   SpO2 100%  Physical Exam Vitals and nursing note reviewed.  Constitutional:      Appearance: He is well-developed.  HENT:     Head: Normocephalic and atraumatic.  Eyes:     Conjunctiva/sclera: Conjunctivae normal.  Pulmonary:     Effort: No respiratory distress.  Musculoskeletal:     Cervical back: Normal range of motion and neck supple.     Comments: Distal lower extremities appear to be well-perfused, warm.  2+ pedal pulses bilaterally.    Skin:    General: Skin is warm and dry.  Neurological:     General: No focal deficit present.     Mental Status: He is alert.     Sensory: Sensory  deficit present.     Comments: Reports bilateral lower extremity numbness to palpation.  He has 5/5 strength in flexors and extensors of the feet, ankles, knees, and hips.     ED Results / Procedures / Treatments   Labs (all labs ordered are listed, but only abnormal results are displayed) Labs Reviewed - No data to display  EKG None  Radiology No results found.  Procedures Procedures    Medications Ordered in ED Medications - No data to display  ED Course/ Medical Decision Making/ A&P    Patient seen and examined. History obtained directly from patient as well as previous emergency department visits.   Labs/EKG: None ordered  Imaging: None ordered.  Considered imaging of the brain given history of MS, however patient has recently had this done and reports no new symptoms.  His exam also appears to be baseline.  Medications/Fluids: None ordered  Most recent vital signs reviewed and are as follows: BP 133/82 (BP Location: Left Arm)   Pulse 60   Temp 97.8 F (36.6 C) (Oral)   Resp 15   SpO2 100%   Initial impression: History of MS, homelessness, malingering  Home treatment plan: Encouraged chronic follow-up  Return instructions discussed with patient: Encouraged return with worsening or progressive symptoms.  Medical Decision Making  Patient with history of MS, with social determinates including homelessness making routine follow-up very difficult.  Patient has multiple emergency visits for same presentation as today.  He appears to be at his baseline per previous notes.  No indication for repeat work-up today.  No concern for stroke, significant MS exacerbation, encephalitis, seizures.        Final Clinical Impression(s) / ED Diagnoses Final diagnoses:  Numbness    Rx / DC Orders ED Discharge Orders     None         Renne Crigler, PA-C 04/02/22 1556    Alvira Monday, MD 04/02/22 2205

## 2022-04-03 ENCOUNTER — Encounter (HOSPITAL_COMMUNITY): Payer: Self-pay

## 2022-04-03 ENCOUNTER — Emergency Department (HOSPITAL_COMMUNITY)
Admission: EM | Admit: 2022-04-03 | Discharge: 2022-04-03 | Disposition: A | Discharge status: Home / Self Care | Payer: Self-pay | Attending: Emergency Medicine | Admitting: Emergency Medicine

## 2022-04-03 DIAGNOSIS — R200 Anesthesia of skin: Secondary | ICD-10-CM | POA: Insufficient documentation

## 2022-04-03 LAB — URINALYSIS, ROUTINE W REFLEX MICROSCOPIC
Bacteria, UA: NONE SEEN
Bilirubin Urine: NEGATIVE
Glucose, UA: NEGATIVE mg/dL
Ketones, ur: 20 mg/dL — AB
Nitrite: NEGATIVE
Protein, ur: 30 mg/dL — AB
Specific Gravity, Urine: 1.028 (ref 1.005–1.030)
pH: 5 (ref 5.0–8.0)

## 2022-04-03 LAB — CBC WITH DIFFERENTIAL/PLATELET
Abs Immature Granulocytes: 0.09 10*3/uL — ABNORMAL HIGH (ref 0.00–0.07)
Basophils Absolute: 0 10*3/uL (ref 0.0–0.1)
Basophils Relative: 0 %
Eosinophils Absolute: 0.1 10*3/uL (ref 0.0–0.5)
Eosinophils Relative: 1 %
HCT: 45.2 % (ref 39.0–52.0)
Hemoglobin: 15.2 g/dL (ref 13.0–17.0)
Immature Granulocytes: 1 %
Lymphocytes Relative: 22 %
Lymphs Abs: 2.9 10*3/uL (ref 0.7–4.0)
MCH: 29.3 pg (ref 26.0–34.0)
MCHC: 33.6 g/dL (ref 30.0–36.0)
MCV: 87.3 fL (ref 80.0–100.0)
Monocytes Absolute: 1.2 10*3/uL — ABNORMAL HIGH (ref 0.1–1.0)
Monocytes Relative: 9 %
Neutro Abs: 8.7 10*3/uL — ABNORMAL HIGH (ref 1.7–7.7)
Neutrophils Relative %: 67 %
Platelets: 231 10*3/uL (ref 150–400)
RBC: 5.18 MIL/uL (ref 4.22–5.81)
RDW: 13.9 % (ref 11.5–15.5)
WBC: 12.9 10*3/uL — ABNORMAL HIGH (ref 4.0–10.5)
nRBC: 0 % (ref 0.0–0.2)

## 2022-04-03 LAB — COMPREHENSIVE METABOLIC PANEL
ALT: 61 U/L — ABNORMAL HIGH (ref 0–44)
AST: 22 U/L (ref 15–41)
Albumin: 4.1 g/dL (ref 3.5–5.0)
Alkaline Phosphatase: 77 U/L (ref 38–126)
Anion gap: 10 (ref 5–15)
BUN: 19 mg/dL (ref 6–20)
CO2: 27 mmol/L (ref 22–32)
Calcium: 9.1 mg/dL (ref 8.9–10.3)
Chloride: 103 mmol/L (ref 98–111)
Creatinine, Ser: 0.95 mg/dL (ref 0.61–1.24)
GFR, Estimated: >60 mL/min (ref >60)
Glucose, Bld: 87 mg/dL (ref 70–99)
Potassium: 3.6 mmol/L (ref 3.5–5.1)
Sodium: 140 mmol/L (ref 135–145)
Total Bilirubin: 1.5 mg/dL — ABNORMAL HIGH (ref 0.3–1.2)
Total Protein: 7 g/dL (ref 6.5–8.1)

## 2022-04-03 LAB — CK: Total CK: 39 U/L — ABNORMAL LOW (ref 49–397)
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
## 2022-04-03 NOTE — ED Provider Triage Note (Cosign Needed)
Emergency Medicine Provider Triage Evaluation Note  Shane Ramirez , a 36 y.o. male  was evaluated in triage.  Pt complains of bilateral leg numbness for multiple years. No recent change, injuries, wounds, or rashes. Seen here numerous times for similar complaints. Currently homeless and hx of MS.   Review of Systems  As above  Physical Exam  BP (!) 150/102 (BP Location: Left Arm)   Pulse 65   Temp 98 F (36.7 C) (Oral)   Resp 18   SpO2 96%  Gen:   Awake, no distress   Resp:  Normal effort  MSK:   Moves extremities without difficulty  Other:    Medical Decision Making  Medically screening exam initiated at 11:42 AM.  Appropriate orders placed.  Shane Ramirez was informed that the remainder of the evaluation will be completed by another provider, this initial triage assessment does not replace that evaluation, and the importance of remaining in the ED until their evaluation is complete.     Shane Buell T, PA-C 04/03/22 1142

## 2022-04-03 NOTE — ED Triage Notes (Signed)
Pt arrived via EMS, c/o leg numbness x1 year. Seen recently for same.

## 2022-04-03 NOTE — ED Notes (Signed)
I provided reinforced discharge education based off of discharge instructions. Pt acknowledged and understood my education. Pt had no further questions/concerns for provider/myself.  °

## 2022-04-03 NOTE — Social Work (Signed)
CSW addded shelters to Pt AVS.

## 2022-04-03 NOTE — Discharge Instructions (Signed)
Follow up with the neurologist. Return to the ED if you develop new or worsening symptoms.

## 2022-04-03 NOTE — ED Notes (Signed)
Pt found wandering garden , brought back to the ED waiting room. Waiting for continued evaluation.

## 2022-04-03 NOTE — ED Notes (Signed)
Pt made multiple statements " I have been here for 3 Gdamn hours." Making multiple statements cursing at staff at multiple times during his visit.

## 2022-04-04 ENCOUNTER — Other Ambulatory Visit: Payer: Self-pay

## 2022-04-04 ENCOUNTER — Emergency Department (HOSPITAL_COMMUNITY)
Admission: EM | Admit: 2022-04-04 | Discharge: 2022-04-04 | Disposition: A | Discharge status: Home / Self Care | Payer: Self-pay | Attending: Emergency Medicine | Admitting: Emergency Medicine

## 2022-04-04 ENCOUNTER — Encounter (HOSPITAL_COMMUNITY): Payer: Self-pay

## 2022-04-04 DIAGNOSIS — M545 Low back pain, unspecified: Secondary | ICD-10-CM | POA: Insufficient documentation

## 2022-04-04 DIAGNOSIS — G8929 Other chronic pain: Secondary | ICD-10-CM | POA: Insufficient documentation

## 2022-04-04 DIAGNOSIS — R200 Anesthesia of skin: Secondary | ICD-10-CM | POA: Insufficient documentation

## 2022-04-04 NOTE — ED Notes (Signed)
Pt ambulated to bathroom with walker. Pt was walking slow with walker to and from bathroom.

## 2022-04-04 NOTE — ED Notes (Signed)
Pt denies loss of bowel or bladder. I asked pt if he has numbness and tingling in his legs and he said, "sort of."

## 2022-04-04 NOTE — ED Provider Notes (Signed)
[facility]-EMERGENCY DEPT Provider Note   CSN: [id number] Arrival date & time: 04/04/22  2024     History  Chief Complaint  Patient presents with   Extremity Weakness    Shane Ramirez is a 36 y.o. male.  HPI 36 year old male presents with numbness to both legs.  He presented via ambulance though the patient states someone else called 911 and not him.  [place] bus [place].  This has been ongoing for over 1 year.  He states he is able to ambulate with a walker which is baseline for him.  He reports no new weakness in the legs.  No back pain or incontinence.  No fevers.  Home Medications Prior to Admission medications   Medication Sig Start Date End Date Taking? Authorizing Provider  escitalopram (LEXAPRO) 10 MG tablet Take 1 tablet (10 mg total) by mouth daily. 03/31/22 03/31/23  Rolly Salter, MD      Allergies    Patient has no known allergies.    Review of Systems   Review of Systems  Constitutional:  Negative for fever.  Genitourinary:        No incontinence  Musculoskeletal:  Negative for back pain.  Neurological:  Positive for numbness. Negative for weakness.    Physical Exam Updated Vital Signs BP (!) 106/92   Pulse 77   Temp 98.3 F (36.8 C) (Oral)   Resp 11   SpO2 100%  Physical Exam Vitals and nursing note reviewed.  Constitutional:      Appearance: He is well-developed.  HENT:     Head: Normocephalic and atraumatic.  Cardiovascular:     Rate and Rhythm: Normal rate and regular rhythm.     Pulses:          Posterior tibial pulses are 2+ on the right side and 2+ on the left side.  Pulmonary:     Effort: Pulmonary effort is normal.     Breath sounds: Normal breath sounds.  Abdominal:     General: There is no distension.     Palpations: Abdomen is soft.     Tenderness: There is no abdominal tenderness.  Skin:    General: Skin is warm and dry.  Neurological:     Mental Status: He is alert.     Comments: 5/5  strength in BLE.  Patient reports subjectively decreased sensation throughout both legs which is symmetric.  He can feel me but states it does not feel normal.     ED Results / Procedures / Treatments   Labs (all labs ordered are listed, but only abnormal results are displayed) Labs Reviewed - No data to display  EKG EKG Interpretation  Date/Time:  Wednesday April 04 2022 20:45:57 EDT Ventricular Rate:  73 PR Interval:  150 QRS Duration: 110 QT Interval:  414 QTC Calculation: 457 R Axis:   77 Text Interpretation: Sinus rhythm no acute ST/T changes No old tracing to compare Confirmed by Pricilla Loveless [phone]) on 04/04/2022 9:12:33 PM  Radiology No results found.  Procedures Procedures    Medications Ordered in ED Medications - No data to display  ED Course/ Medical Decision Making/ A&P                           Medical Decision Making  Chart review shows he has been in and out of the ER and has twice left the hospital AMA for similar symptoms.  He has  received apparently adequate doses of steroids for potential MS flare.  Right now he has no weakness and he states he has no acute complaints but just the chronic numbness.  At this point, with multiple recent work-ups including labs yesterday, I do not think further work-up or imaging is needed.  Will discharge.        Final Clinical Impression(s) / ED Diagnoses Final diagnoses:  Numbness    Rx / DC Orders ED Discharge Orders     None         Pricilla Loveless, MD 04/04/22 2151

## 2022-04-04 NOTE — ED Notes (Signed)
Pt was given a [place] bag and ice water

## 2022-04-04 NOTE — ED Provider Notes (Signed)
[facility] EMERGENCY DEPARTMENT Provider Note   CSN: [id number] Arrival date & time: 04/04/22  4765     History Chief Complaint  Patient presents with   Back Pain    Shane Ramirez is a 36 y.o. male with h/o MS and currently experiencing homelessness presents emergency department for evaluation of bilateral leg numbness for the past year.  He reports that he "cannot feel his legs".  Triage note mentioned something about an elevator which the patient did not endorse to me. He mentioned some lower back pain after questioning. Denies any trauma to the area. The patient was recently seen here multiple times for this complaint in the past, most notably less than 24 hours ago.  He denies any weakness to his legs and is still ambulatory with his walker which is at his baseline.  He denies any bowel or bladder incontinence. He denies any fevers, chills, nausea, vomiting, chest pain, shortness of breath, melena, dysuria, hematuria, rashes, or leg swelling. Denies any IVDU ever.   Back Pain Associated symptoms: numbness   Associated symptoms: no abdominal pain, no chest pain, no dysuria, no fever and no weakness        Home Medications Prior to Admission medications   Medication Sig Start Date End Date Taking? Authorizing Provider  escitalopram (LEXAPRO) 10 MG tablet Take 1 tablet (10 mg total) by mouth daily. 03/31/22 03/31/23  Rolly Salter, MD      Allergies    Patient has no known allergies.    Review of Systems   Review of Systems  Constitutional:  Negative for chills and fever.  Respiratory:  Negative for shortness of breath.   Cardiovascular:  Negative for chest pain.  Gastrointestinal:  Negative for abdominal pain, nausea and vomiting.  Genitourinary:  Negative for dysuria and hematuria.  Musculoskeletal:  Positive for back pain.  Neurological:  Positive for numbness. Negative for weakness.    Physical Exam Updated Vital Signs BP 128/69 (BP Location:  Right Arm)   Pulse 79   Temp 98.4 F (36.9 C) (Oral)   Resp 20   Ht 6\' 4"  (1.93 m)   SpO2 100%   BMI 27.40 kg/m  Physical Exam Vitals and nursing note reviewed.  Constitutional:      General: He is not in acute distress.    Appearance: Normal appearance. He is not toxic-appearing.  Eyes:     General: No scleral icterus. Pulmonary:     Effort: Pulmonary effort is normal. No respiratory distress.  Musculoskeletal:        General: No deformity.     Cervical back: Normal range of motion.     Comments: No midline cervical or thoracic tenderness to palpation.  Patient has diffuse lower lumbar paraspinal and midline tenderness.  No step-offs or deformities.  No increased warmth palpated.  Skin:    General: Skin is warm and dry.  Neurological:     General: No focal deficit present.     Mental Status: He is alert. Mental status is at baseline.     Comments: Strength is 5/5 in patient's lower extremities.  Sensation surprisingly is intact in bilateral extremities to both light touch and deep pressure.  Palpable pulses.  Compartments are soft.  Patient ambulatory with walker.    ED Results / Procedures / Treatments   Labs (all labs ordered are listed, but only abnormal results are displayed) Labs Reviewed - No data to display  EKG None  Radiology No results found.  Procedures  Procedures   Medications Ordered in ED Medications - No data to display  ED Course/ Medical Decision Making/ A&P                           Medical Decision Making   36 year old male presents the emergency department for evaluation of a lower leg and numbness for the past year.  Patient has not seen here multiple times in the ER for similar complaints with the last evaluation being less than 24 hours ago.  With prior chart review, patient was seen here yesterday less than 24 hours ago.  At that time, his labs are reassuring.  The physician discussed with neurologist, Dr. Amada Jupiter about the patient.   For his 3 doses of steroids received last week it was determined it was adequate treatment for any MS exacerbation.  No other imaging was recommended at that time.  TOC was consulted for homelessness resources.  Patient is ambulatory with his walker at baseline.  Additionally he was seen on 6/18, 6/17 x 2, 6/15, 6/9 for similar symptoms.   He is ambulatory with his walker today.  I do not see the need for any labs or imaging as the patient was seen less than 24 hours ago with unremarkable labs and neurology did not see any use for any repeat or additional imaging at that time as well.  There is no new injury or new symptoms.  The patient is not having any loss of bowel or bladder, any fevers, denies any IV drug use, he has had this numbness sensation in his bilateral legs for over a year and has already been treated for any MS exacerbation.  Given this, the patient is safe for discharge.  I discussed strict return precautions and red flag symptoms with the patient such as fecal incontinence, urinary incontinence, urinary retention, fever, saddle anesthesia, etc. .  Patient verbalized understanding and agrees to plan.  Patient stable being discharged home in good condition.  I discussed this case with my attending physician who cosigned this note including patient's presenting symptoms, physical exam, and planned diagnostics and interventions. Attending physician stated agreement with plan or made changes to plan which were implemented.   Final Clinical Impression(s) / ED Diagnoses Final diagnoses:  Numbness  Chronic bilateral low back pain without sciatica    Rx / DC Orders ED Discharge Orders     None         Achille Rich, PA-C 04/04/22 1658    Benjiman Core, MD 04/04/22 2333

## 2022-04-04 NOTE — ED Triage Notes (Signed)
[place]. Pt seen at [facility] ED earlier today for same complaint. Pt c/o BLE weakness. EMS reports pt is ambulatory, but pt will say he cannot ambulate.

## 2022-04-04 NOTE — ED Notes (Signed)
An After Visit Summary was printed and given to the patient. Discharge instructions given and no further questions at this time.  

## 2022-04-04 NOTE — ED Triage Notes (Signed)
[place] [facility] [place] Pt states he got stuck in an elevator unsure for how long. Pt is c/o lower back pain from being stuck.   128 palpated 70  HR 89 CBG

## 2022-04-04 NOTE — Discharge Instructions (Signed)
You were seen in the ER for evaluation of your bilateral leg numbness and back pain for the past year. I have included the information for a neurologist for you to follow up with in an out patient setting. If you have any concern, new or worsening symptoms, please return to the nearest ER for re-evaluation.   Contact a health care provider if: You feel depressed. You develop new pain or numbness. You have tremors. You have problems with sexual function. Get help right away if: You develop paralysis. You develop numbness. You have problems with your bladder or bowel function. You develop double vision. You lose vision in one or both eyes. You develop suicidal thoughts. You develop severe confusion. Get help right away if you feel like you may hurt yourself or others, or have thoughts about taking your own life. Go to your nearest emergency room or: Call 911. Call the National Suicide Prevention Lifeline at (562)382-7193 or 988. This is open 24 hours a day. Text the Crisis Text Line at 630-706-1217.

## 2022-04-05 ENCOUNTER — Emergency Department (HOSPITAL_COMMUNITY)
Admission: EM | Admit: 2022-04-05 | Discharge: 2022-04-05 | Disposition: A | Discharge status: Home / Self Care | Payer: Self-pay | Attending: Emergency Medicine | Admitting: Emergency Medicine

## 2022-04-05 ENCOUNTER — Encounter (HOSPITAL_COMMUNITY): Payer: Self-pay | Admitting: Emergency Medicine

## 2022-04-05 ENCOUNTER — Encounter (HOSPITAL_COMMUNITY): Payer: Self-pay

## 2022-04-05 ENCOUNTER — Other Ambulatory Visit: Payer: Self-pay

## 2022-04-05 DIAGNOSIS — M79604 Pain in right leg: Secondary | ICD-10-CM | POA: Insufficient documentation

## 2022-04-05 DIAGNOSIS — Z5900 Homelessness unspecified: Secondary | ICD-10-CM | POA: Insufficient documentation

## 2022-04-05 DIAGNOSIS — M79605 Pain in left leg: Secondary | ICD-10-CM | POA: Insufficient documentation

## 2022-04-05 DIAGNOSIS — Z5902 Unsheltered homelessness: Secondary | ICD-10-CM | POA: Insufficient documentation

## 2022-04-05 DIAGNOSIS — R531 Weakness: Secondary | ICD-10-CM | POA: Insufficient documentation

## 2022-04-05 DIAGNOSIS — G35 Multiple sclerosis: Secondary | ICD-10-CM | POA: Insufficient documentation

## 2022-04-05 MED ORDER — DEXAMETHASONE 4 MG PO TABS
4.0000 mg | ORAL_TABLET | Freq: Once | ORAL | Status: AC
  Administered 2022-04-05: 4 mg via ORAL
  Filled 2022-04-05: qty 1

## 2022-04-05 NOTE — ED Provider Notes (Incomplete)
  [facility]-EMERGENCY DEPT Provider Note   CSN: [id number] Arrival date & time: 04/05/22  1737     History {Add pertinent medical, surgical, social history, OB history to HPI:1} Chief Complaint  Patient presents with   Boarder    Simuel Stebner is a 36 y.o. male.  Patient was seen 3 times a day including this visit.  He has a mask.  He is looking for a place to stay.   Weakness      Home Medications Prior to Admission medications   Medication Sig Start Date End Date Taking? Authorizing Provider  ibuprofen (ADVIL) 200 MG tablet Take 200-400 mg by mouth every 6 (six) hours as needed for mild pain or headache.   Yes [provider]  escitalopram (LEXAPRO) 10 MG tablet Take 1 tablet (10 mg total) by mouth daily. Patient not taking: Reported on 04/05/2022 03/31/22 03/31/23  Rolly Salter, MD      Allergies    Patient has no known allergies.    Review of Systems   Review of Systems  Neurological:  Positive for weakness.    Physical Exam Updated Vital Signs BP 132/73 (BP Location: Left Arm)   Pulse 69   Temp 98.7 F (37.1 C) (Oral)   Resp 18   Ht 6\' 4"  (1.93 m)   Wt 102.1 kg   SpO2 97%   BMI 27.40 kg/m  Physical Exam  ED Results / Procedures / Treatments   Labs (all labs ordered are listed, but only abnormal results are displayed) Labs Reviewed - No data to display  EKG None  Radiology No results found.  Procedures Procedures  {Document cardiac monitor, telemetry assessment procedure when appropriate:1}  Medications Ordered in ED Medications - No data to display  ED Course/ Medical Decision Making/ A&P                           Medical Decision Making  Patient with MS and homelessness.  Patient has been told by the social worker were to follow-up tomorrow at 8 AM  {Document critical care time when appropriate:1} {Document review of labs and clinical decision tools ie heart score, Chads2Vasc2 etc:1}  {Document your  independent review of radiology images, and any outside records:1} {Document your discussion with family members, caretakers, and with consultants:1} {Document social determinants of health affecting pt's care:1} {Document your decision making why or why not admission, treatments were needed:1} Final Clinical Impression(s) / ED Diagnoses Final diagnoses:  Homeless    Rx / DC Orders ED Discharge Orders     None
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
## 2022-04-05 NOTE — Discharge Instructions (Signed)
You were seen for bilateral leg pain which seems to be a worsening of your underlying multiple sclerosis.  At this time you appear stable for discharge.  Please follow-up as needed

## 2022-04-05 NOTE — ED Provider Notes (Cosign Needed)
[facility]-EMERGENCY DEPT Provider Note   CSN: [id number] Arrival date & time: 04/05/22  2409     History  Chief Complaint  Patient presents with   Leg Pain    Shane Ramirez is a 36 y.o. male.  Patient was brought in by EMS complaining of bilateral leg pain.  Patient has MS and states that this is just a worsening of his normal symptoms.  Denies any new weakness or injury.  Patient is homeless and is unable to take prescriptions home with them due to inability to store them.  EMS states that patient was lying in the grass across the street from the hospital when they found him.  Endorses bilateral leg pain and no other complaints at this time.  Only relevant past medical history is multiple sclerosis  HPI     Home Medications Prior to Admission medications   Medication Sig Start Date End Date Taking? Authorizing Provider  escitalopram (LEXAPRO) 10 MG tablet Take 1 tablet (10 mg total) by mouth daily. 03/31/22 03/31/23  Rolly Salter, MD      Allergies    Patient has no known allergies.    Review of Systems   Review of Systems  Musculoskeletal:  Positive for myalgias.    Physical Exam Updated Vital Signs BP 135/88 (BP Location: Right Arm)   Pulse 82   Temp 98.5 F (36.9 C) (Oral)   Resp 16   Ht 6\' 4"  (1.93 m)   Wt 102.1 kg   SpO2 100%   BMI 27.40 kg/m  Physical Exam Vitals and nursing note reviewed.  Constitutional:      General: He is not in acute distress.    Appearance: He is normal weight.  HENT:     Head: Normocephalic and atraumatic.     Mouth/Throat:     Mouth: Mucous membranes are moist.  Eyes:     Conjunctiva/sclera: Conjunctivae normal.  Cardiovascular:     Rate and Rhythm: Normal rate.  Pulmonary:     Effort: Pulmonary effort is normal.  Abdominal:     Palpations: Abdomen is soft.  Musculoskeletal:        General: No swelling, tenderness, deformity or signs of injury.     Cervical back: Normal range of motion.      Comments: Patient ambulates with walker as he does at his baseline  Skin:    General: Skin is warm and dry.  Neurological:     General: No focal deficit present.     Mental Status: He is alert.     ED Results / Procedures / Treatments   Labs (all labs ordered are listed, but only abnormal results are displayed) Labs Reviewed - No data to display  EKG None  Radiology No results found.  Procedures Procedures    Medications Ordered in ED Medications  dexamethasone (DECADRON) tablet 4 mg (4 mg Oral Given 04/05/22 1103)    ED Course/ Medical Decision Making/ A&P                           Medical Decision Making  Patient presents with bilateral leg pain.  Differential would include worsening MS symptoms, fracture, dislocation, soft tissue injuries, and others  Based on the patient's history with no recent trauma, this appears to be a worsening of his MS.  That being said, this is also the patient's 16th emergency department visit since the beginning of June.  The patient is homeless and  was outside in the rain when EMS brought him in.  He stated he was cold.  I believe that he may be here as well just due to lack of housing security.  With no reported or apparent injury, I see no indication for imaging at today's visit.  I have ordered Decadron for the patient's worsening MS.  The patient was the same on reassessment.   Patient requested food prior to discharge. Provided. Discharge appropriate at this time        Final Clinical Impression(s) / ED Diagnoses Final diagnoses:  Bilateral leg pain  MS (multiple sclerosis) [facility])  Homeless    Rx / DC Orders ED Discharge Orders     None         Pamala Duffel 04/05/22 1120

## 2022-04-05 NOTE — Discharge Instructions (Signed)
Follow-up with [facility] at 8 AM tomorrow as instructed earlier today.

## 2022-04-05 NOTE — ED Provider Notes (Signed)
[facility]-EMERGENCY DEPT Provider Note   CSN: [id number] Arrival date & time: 04/05/22  1204     History  Chief Complaint  Patient presents with   Leg Pain    Shane Ramirez is a 36 y.o. male.  Patient presents again to the emergency department this afternoon after being evaluated and discharged this morning for bilateral leg pain.  In triage patient reported that he cannot feel his legs.  Upon my arrival in the room I talked to the patient and the patient has no complaints.  Patient claims that his "ride" saw his discharge paperwork and so they had to take him back to the hospital.  Patient is resting comfortably in the chair and states that he is no worse than before and that he can feel his legs.  Patient was ambulatory with a walker upon arrival.  Past medical history significant for multiple sclerosis.  Patient is homeless HPI     Home Medications Prior to Admission medications   Medication Sig Start Date End Date Taking? Authorizing Provider  escitalopram (LEXAPRO) 10 MG tablet Take 1 tablet (10 mg total) by mouth daily. 03/31/22 03/31/23  Rolly Salter, MD      Allergies    Patient has no known allergies.    Review of Systems   Review of Systems  Musculoskeletal:  Negative for arthralgias.  Neurological:  Negative for numbness.    Physical Exam Updated Vital Signs BP (!) 142/72 (BP Location: Right Arm)   Pulse 85   Temp 98 F (36.7 C) (Oral)   Resp 18   SpO2 99%  Physical Exam Physical Exam Vitals and nursing note reviewed.  Constitutional:      General: He is not in acute distress.    Appearance: He is normal weight.  HENT:     Head: Normocephalic and atraumatic.     Mouth/Throat:     Mouth: Mucous membranes are moist.  Eyes:     Conjunctiva/sclera: Conjunctivae normal.  Cardiovascular:     Rate and Rhythm: Normal rate.  Pulmonary:     Effort: Pulmonary effort is normal.  Abdominal:     Palpations: Abdomen is soft.   Musculoskeletal:        General: No swelling, tenderness, deformity or signs of injury.     Cervical back: Normal range of motion.     Comments: Patient ambulates with walker as he does at his baseline  Skin:    General: Skin is warm and dry.  Neurological:     General: No focal deficit present.     Mental Status: He is alert.     ED Results / Procedures / Treatments   Labs (all labs ordered are listed, but only abnormal results are displayed) Labs Reviewed - No data to display  EKG None  Radiology No results found.  Procedures Procedures    Medications Ordered in ED Medications - No data to display  ED Course/ Medical Decision Making/ A&P                           Medical Decision Making  The patient has no complaints at the current time.  He is unable to explain who the ride was who told him he had to come back to the hospital.  I feel this is likely due to his homelessness. He has frequently come multiple times daily over the past month.   There is no complaint to workup and  evaluate at this time. Plan to discharge the patient at this time.         Final Clinical Impression(s) / ED Diagnoses Final diagnoses:  Homelessness    Rx / DC Orders ED Discharge Orders     None         Pamala Duffel 04/05/22 1334    Sloan Leiter, DO 04/06/22 [phone]

## 2022-04-05 NOTE — Discharge Instructions (Addendum)
You were seen today after being brought to the hospital by a ride for an unknown reason. As discussed, there is no reason for evaluation with no current complaint. I have provided resource information on local shelters.

## 2022-04-05 NOTE — ED Triage Notes (Signed)
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

## 2022-04-05 NOTE — ED Triage Notes (Signed)
Patient reports that he cannot feel his legs. Recently seen and discharged ago. Ambulatory with walker.

## 2022-04-09 ENCOUNTER — Telehealth (HOSPITAL_COMMUNITY): Payer: Self-pay

## 2022-04-14 ENCOUNTER — Encounter (HOSPITAL_BASED_OUTPATIENT_CLINIC_OR_DEPARTMENT_OTHER): Payer: Self-pay

## 2022-04-14 ENCOUNTER — Emergency Department (HOSPITAL_BASED_OUTPATIENT_CLINIC_OR_DEPARTMENT_OTHER)
Admission: EM | Admit: 2022-04-14 | Discharge: 2022-04-14 | Disposition: A | Discharge status: Home / Self Care | Payer: Self-pay | Attending: Emergency Medicine | Admitting: Emergency Medicine

## 2022-04-14 ENCOUNTER — Other Ambulatory Visit: Payer: Self-pay

## 2022-04-14 DIAGNOSIS — G35D Multiple sclerosis, unspecified: Secondary | ICD-10-CM

## 2022-04-14 DIAGNOSIS — Z5900 Homelessness unspecified: Secondary | ICD-10-CM

## 2022-04-14 DIAGNOSIS — G35 Multiple sclerosis: Secondary | ICD-10-CM | POA: Insufficient documentation

## 2022-04-14 NOTE — ED Notes (Signed)
Patient ambulated out with walker and belongings without incident. Patient states he has a safe place go.  Patient declined discharge paperwork.

## 2022-04-14 NOTE — ED Triage Notes (Addendum)
Patient arrived via EMS with complaints of chronic bilateral leg numbness - states he just wants "somewhere cool to lie down" Patient is at baseline ambulation status - walks with walker Patient has been diagnosed with MS.  Patient was seen recently for same 6/22  1712: Patient given hot meal per request and MD request.

## 2022-04-14 NOTE — ED Provider Notes (Signed)
[facility] EMERGENCY DEPARTMENT Provider Note   CSN: [id number] Arrival date & time: 04/14/22  1557     History {Add pertinent medical, surgical, social history, OB history to HPI:1} Chief Complaint  Patient presents with   chronic leg numbness   wants a cool place to rest    Shane Ramirez is a 36 y.o. male.  HPI     36 year old male with a history of MS, frequent emergency department visits, homelessness presents with concern for chronic bilateral leg numbness.  Had been seen in the emergency department on June 22 x2, June 21 x2, June 20.  Had labs completed including CK on June 20 which showed no significant findings.  On June 14 he had received steroids, and neurology was contacted at that time and then again on the 20th and did not feel he required further steroids.  His recent MRIs on June 14 showed small area of demyelination in the brain but no active demyelination areas of the cervical cord.  Home Medications Prior to Admission medications   Medication Sig Start Date End Date Taking? Authorizing Provider  escitalopram (LEXAPRO) 10 MG tablet Take 1 tablet (10 mg total) by mouth daily. Patient not taking: Reported on 04/05/2022 03/31/22 03/31/23  Rolly Salter, MD  ibuprofen (ADVIL) 200 MG tablet Take 200-400 mg by mouth every 6 (six) hours as needed for mild pain or headache.    [provider]      Allergies    Patient has no known allergies.    Review of Systems   Review of Systems  Physical Exam Updated Vital Signs BP (!) 103/58   Pulse 87   Resp 16   Ht 6\' 4"  (1.93 m)   Wt 102.1 kg   SpO2 96%   BMI 27.40 kg/m  Physical Exam Vitals and nursing note reviewed.  Constitutional:      General: He is not in acute distress.    Appearance: Normal appearance. He is not ill-appearing, toxic-appearing or diaphoretic.  HENT:     Head: Normocephalic.  Eyes:     Conjunctiva/sclera: Conjunctivae normal.  Cardiovascular:     Rate and Rhythm:  Normal rate and regular rhythm.     Pulses: Normal pulses.  Pulmonary:     Effort: Pulmonary effort is normal. No respiratory distress.  Musculoskeletal:        General: No deformity or signs of injury.     Cervical back: No rigidity.  Skin:    General: Skin is warm and dry.     Coloration: Skin is not jaundiced or pale.  Neurological:     General: No focal deficit present.     Mental Status: He is alert and oriented to person, place, and time.     ED Results / Procedures / Treatments   Labs (all labs ordered are listed, but only abnormal results are displayed) Labs Reviewed - No data to display  EKG None  Radiology No results found.  Procedures Procedures  {Document cardiac monitor, telemetry assessment procedure when appropriate:1}  Medications Ordered in ED Medications - No data to display  ED Course/ Medical Decision Making/ A&P                           Medical Decision Making   36 year old male with a history of MS, frequent emergency department visits, homelessness presents with concern for chronic bilateral leg numbness.    {Document critical care time when appropriate:1} {Document  review of labs and clinical decision tools ie heart score, Chads2Vasc2 etc:1}  {Document your independent review of radiology images, and any outside records:1} {Document your discussion with family members, caretakers, and with consultants:1} {Document social determinants of health affecting pt's care:1} {Document your decision making why or why not admission, treatments were needed:1} Final Clinical Impression(s) / ED Diagnoses Final diagnoses:  None    Rx / DC Orders ED Discharge Orders     None

## 2022-04-15 ENCOUNTER — Emergency Department (HOSPITAL_COMMUNITY)
Admission: EM | Admit: 2022-04-15 | Discharge: 2022-04-15 | Disposition: A | Discharge status: Home / Self Care | Payer: Self-pay | Attending: Emergency Medicine | Admitting: Emergency Medicine

## 2022-04-15 ENCOUNTER — Encounter (HOSPITAL_COMMUNITY): Payer: Self-pay

## 2022-04-15 ENCOUNTER — Other Ambulatory Visit: Payer: Self-pay

## 2022-04-15 DIAGNOSIS — Z5900 Homelessness unspecified: Secondary | ICD-10-CM

## 2022-04-15 DIAGNOSIS — R202 Paresthesia of skin: Secondary | ICD-10-CM | POA: Insufficient documentation

## 2022-04-15 DIAGNOSIS — G35 Multiple sclerosis: Secondary | ICD-10-CM

## 2022-04-15 NOTE — ED Notes (Signed)
Pt ambulated from triage to waiting room independently w/ walker. Pt reports he cannot feel his legs.

## 2022-04-15 NOTE — ED Provider Notes (Signed)
[facility] EMERGENCY DEPARTMENT Provider Note   CSN: [id number] Arrival date & time: 04/15/22  1825     History  Chief Complaint  Patient presents with   Leg Pain    Shane Ramirez is a 36 y.o. male.  36 year old male who presents with persistent bilateral lower extremity paresthesias.  Patient able to ambulate.  Has had this numbness for about a year.  Been seen multiple times for same.  Has a history of multiple sclerosis but is not currently seeking treatment.  Has no other complaints        Home Medications Prior to Admission medications   Medication Sig Start Date End Date Taking? Authorizing Provider  escitalopram (LEXAPRO) 10 MG tablet Take 1 tablet (10 mg total) by mouth daily. Patient not taking: Reported on 04/05/2022 03/31/22 03/31/23  Rolly Salter, MD  ibuprofen (ADVIL) 200 MG tablet Take 200-400 mg by mouth every 6 (six) hours as needed for mild pain or headache.    [provider]      Allergies    Patient has no known allergies.    Review of Systems   Review of Systems  All other systems reviewed and are negative.   Physical Exam Updated Vital Signs BP 94/60 (BP Location: Right Arm)   Pulse 99   Temp 98.1 F (36.7 C) (Oral)   Resp 16   SpO2 95%  Physical Exam Vitals and nursing note reviewed.  Constitutional:      General: He is not in acute distress.    Appearance: Normal appearance. He is well-developed. He is not toxic-appearing.  HENT:     Head: Normocephalic and atraumatic.  Eyes:     General: Lids are normal.     Conjunctiva/sclera: Conjunctivae normal.     Pupils: Pupils are equal, round, and reactive to light.  Neck:     Thyroid: No thyroid mass.     Trachea: No tracheal deviation.  Cardiovascular:     Rate and Rhythm: Normal rate and regular rhythm.     Heart sounds: Normal heart sounds. No murmur heard.    No gallop.  Pulmonary:     Effort: Pulmonary effort is normal. No respiratory distress.      Breath sounds: Normal breath sounds. No stridor. No decreased breath sounds, wheezing, rhonchi or rales.  Abdominal:     General: There is no distension.     Palpations: Abdomen is soft.     Tenderness: There is no abdominal tenderness. There is no rebound.  Musculoskeletal:        General: No tenderness. Normal range of motion.     Cervical back: Normal range of motion and neck supple.  Skin:    General: Skin is warm and dry.     Findings: No abrasion or rash.  Neurological:     Mental Status: He is alert and oriented to person, place, and time. Mental status is at baseline.     GCS: GCS eye subscore is 4. GCS verbal subscore is 5. GCS motor subscore is 6.     Cranial Nerves: No cranial nerve deficit.     Sensory: Sensory deficit present.     Motor: No weakness.     Comments: Strength is 5 of 5 bilateral lower as well as upper extremities   Psychiatric:        Attention and Perception: Attention normal.        Speech: Speech normal.        Behavior: Behavior  normal.     ED Results / Procedures / Treatments   Labs (all labs ordered are listed, but only abnormal results are displayed) Labs Reviewed - No data to display  EKG None  Radiology No results found.  Procedures Procedures    Medications Ordered in ED Medications - No data to display  ED Course/ Medical Decision Making/ A&P                           Medical Decision Making  He has no new focal neurological deficits.  No further imaging required.  Most of his issues seem to be related to his homelessness.  Will discharge at this time.  Will provide homeless resources as well as referral to the community wellness center so that he can establish care for his MS        Final Clinical Impression(s) / ED Diagnoses Final diagnoses:  None    Rx / DC Orders ED Discharge Orders     None         Lorre Nick, MD 04/15/22 1916

## 2022-04-15 NOTE — ED Triage Notes (Addendum)
[place] [facility] [place], pt was laying in the grass upon EMS arrival, pt states he cannot walk but in triage pt got up out of wheelchair and was able to get himself to the sink. Pt was seen for the same earlier today and was discharged

## 2022-04-15 NOTE — ED Notes (Signed)
Pt ambulated with walker back to room dressed in clean clothing.  States he just wants something to eat and to leave. ..provider notified.

## 2022-04-15 NOTE — ED Notes (Signed)
Ambulated to shower  with walker, EMS personal brought in clean donated clothing for Shane Ramirez along with proper foot wear.

## 2022-04-15 NOTE — ED Notes (Signed)
Given bag lunch to go, pt has personal belonging bag that he states " I don't want that, nothing is mine" ..this nurse calmly noted that he refuses to take his belongings and not argue with pt. Shane Ramirez gathered his walker and ambulated to the lobby while using foul language.

## 2022-04-15 NOTE — ED Notes (Signed)
Patient walked ot bathroom without walker

## 2022-04-15 NOTE — ED Provider Notes (Signed)
[facility] EMERGENCY DEPARTMENT Provider Note   CSN: [id number] Arrival date & time: 04/15/22  1000     History  Chief Complaint  Patient presents with   Shane Ramirez is a 36 y.o. male.  36 year old male brought in by EMS without acute complaint or concern today.  Patient is homeless, states that he was sleeping and when he woke up EMS was there.  He does not know who called 911 but was agreeable to come to the emergency room.  He states that he has MS and his legs have been numb for over a year.  He denies any acute complaints at this time.       Home Medications Prior to Admission medications   Medication Sig Start Date End Date Taking? Authorizing Provider  escitalopram (LEXAPRO) 10 MG tablet Take 1 tablet (10 mg total) by mouth daily. Patient not taking: Reported on 04/05/2022 03/31/22 03/31/23  Rolly Salter, MD  ibuprofen (ADVIL) 200 MG tablet Take 200-400 mg by mouth every 6 (six) hours as needed for mild pain or headache.    [provider]      Allergies    Patient has no known allergies.    Review of Systems   Review of Systems Negative except as per HPI Physical Exam Updated Vital Signs BP (!) 100/56 (BP Location: Right Arm)   Pulse 70   Temp 98.4 F (36.9 C) (Oral)   Resp 14   Ht 6' 4.38" (1.94 m)   Wt 104.3 kg   SpO2 100%   BMI 27.72 kg/m  Physical Exam Vitals and nursing note reviewed.  Constitutional:      General: He is not in acute distress.    Appearance: He is well-developed. He is not diaphoretic.  HENT:     Head: Normocephalic and atraumatic.  Cardiovascular:     Pulses: Normal pulses.  Pulmonary:     Effort: Pulmonary effort is normal.  Abdominal:     Palpations: Abdomen is soft.     Tenderness: There is no abdominal tenderness.  Musculoskeletal:     Right lower leg: No edema.     Left lower leg: No edema.  Skin:    General: Skin is warm and dry.     Findings: No erythema or rash.   Neurological:     Mental Status: He is alert and oriented to person, place, and time.  Psychiatric:        Behavior: Behavior normal.     ED Results / Procedures / Treatments   Labs (all labs ordered are listed, but only abnormal results are displayed) Labs Reviewed - No data to display  EKG None  Radiology No results found.  Procedures Procedures    Medications Ordered in ED Medications - No data to display  ED Course/ Medical Decision Making/ A&P                           Medical Decision Making  36 year old male brought in by EMS without concern today.  Patient is homeless, has been seen several times in the emergency room, most recently yesterday evening.  Regarding patient's complaint of leg numbness, this has been an ongoing problem for the past year.  His last MRIs were completed on June 14 of this year, without active areas of demyelination of the cervical cord.  Patient has feces on his legs.  Plan is to offer patient an opportunity  to get cleaned up prior to discharge.        Final Clinical Impression(s) / ED Diagnoses Final diagnoses:  Homeless    Rx / DC Orders ED Discharge Orders     None         Alden Hipp 04/15/22 1138    Gerhard Munch, MD 04/16/22 2157

## 2022-04-15 NOTE — ED Triage Notes (Signed)
States he could not feel his legs and fell down in front of [employer] . Unable to get up and had a Large Bowel movement.

## 2022-04-17 ENCOUNTER — Encounter (HOSPITAL_COMMUNITY): Payer: Self-pay

## 2022-04-17 ENCOUNTER — Other Ambulatory Visit: Payer: Self-pay

## 2022-04-17 ENCOUNTER — Emergency Department (HOSPITAL_COMMUNITY)
Admission: EM | Admit: 2022-04-17 | Discharge: 2022-04-17 | Disposition: A | Discharge status: Home / Self Care | Payer: Self-pay | Attending: Emergency Medicine | Admitting: Emergency Medicine

## 2022-04-17 DIAGNOSIS — M79671 Pain in right foot: Secondary | ICD-10-CM | POA: Insufficient documentation

## 2022-04-17 DIAGNOSIS — Z765 Malingerer [conscious simulation]: Secondary | ICD-10-CM | POA: Insufficient documentation

## 2022-04-17 DIAGNOSIS — R200 Anesthesia of skin: Secondary | ICD-10-CM

## 2022-04-17 DIAGNOSIS — M79672 Pain in left foot: Secondary | ICD-10-CM | POA: Insufficient documentation

## 2022-04-17 DIAGNOSIS — Z5900 Homelessness unspecified: Secondary | ICD-10-CM

## 2022-04-17 NOTE — ED Provider Notes (Signed)
[facility] EMERGENCY DEPARTMENT Provider Note   CSN: [id number] Arrival date & time: 04/17/22  1400     History  Chief Complaint  Patient presents with   Leg Pain    Supreme Rybarczyk is a 36 y.o. male.  36 yo M with a chief complaints of bilateral foot pain.  This is a chronic problem for him.  When asked him what brought him in here this afternoon he told me he was not sure.  Later in the conversation he told me that he was planning on killing himself.  Says that he plans to step in front of a car.  Tells me is never tried to kill himself before.   Leg Pain      Home Medications Prior to Admission medications   Medication Sig Start Date End Date Taking? Authorizing Provider  escitalopram (LEXAPRO) 10 MG tablet Take 1 tablet (10 mg total) by mouth daily. Patient not taking: Reported on 04/05/2022 03/31/22 03/31/23  Rolly Salter, MD  ibuprofen (ADVIL) 200 MG tablet Take 200-400 mg by mouth every 6 (six) hours as needed for mild pain or headache.    [provider]      Allergies    Patient has no known allergies.    Review of Systems   Review of Systems  Physical Exam Updated Vital Signs BP 120/71   Pulse (!) 106   Temp 99.1 F (37.3 C) (Oral)   Resp 18   Ht 6\' 3"  (1.905 m)   Wt 104.3 kg   SpO2 97%   BMI 28.74 kg/m  Physical Exam Vitals and nursing note reviewed.  Constitutional:      Appearance: He is well-developed.  HENT:     Head: Normocephalic and atraumatic.  Eyes:     Pupils: Pupils are equal, round, and reactive to light.  Neck:     Vascular: No JVD.  Cardiovascular:     Rate and Rhythm: Normal rate and regular rhythm.     Heart sounds: No murmur heard.    No friction rub. No gallop.  Pulmonary:     Effort: No respiratory distress.     Breath sounds: No wheezing.  Abdominal:     General: There is no distension.     Tenderness: There is no abdominal tenderness. There is no guarding or rebound.  Musculoskeletal:         General: Normal range of motion.     Cervical back: Normal range of motion and neck supple.     Comments: +3 reflexes bilaterally.  No clonus.  Skin:    Coloration: Skin is not pale.     Findings: No rash.  Neurological:     Mental Status: He is alert and oriented to person, place, and time.  Psychiatric:        Behavior: Behavior normal.     ED Results / Procedures / Treatments   Labs (all labs ordered are listed, but only abnormal results are displayed) Labs Reviewed - No data to display  EKG None  Radiology No results found.  Procedures Procedures    Medications Ordered in ED Medications - No data to display  ED Course/ Medical Decision Making/ A&P                           Medical Decision Making  36 yo M well-known to this medical system with 24 visits in the past 6 months and this is their  second visit today.  Patient has been seen frequently and has been complaining mostly of leg pain.  He once I had discussed with him about possibly going home mentioned that he was suicidal.  I think that this is more of an intent for secondary gain and not an attempt to try and harm himself.  I discussed with him about going to the behavioral health urgent care center.  Given information for follow-up.  3:15 PM:  I have discussed the diagnosis/risks/treatment options with the patient.  Evaluation and diagnostic testing in the emergency department does not suggest an emergent condition requiring admission or immediate intervention beyond what has been performed at this time.  They will follow up with  PCP. We also discussed returning to the ED immediately if new or worsening sx occur. We discussed the sx which are most concerning (e.g., sudden worsening pain, fever, inability to tolerate by mouth) that necessitate immediate return. Medications administered to the patient during their visit and any new prescriptions provided to the patient are listed below.  Medications given during  this visit Medications - No data to display   The patient appears reasonably screen and/or stabilized for discharge and I doubt any other medical condition or other [facility] requiring further screening, evaluation, or treatment in the ED at this time prior to discharge.          Final Clinical Impression(s) / ED Diagnoses Final diagnoses:  Malingering    Rx / DC Orders ED Discharge Orders     None         Melene Plan, DO 04/17/22 1515

## 2022-04-17 NOTE — ED Triage Notes (Signed)
Pt arrived via [facility] from home for c/o bilateral lower leg pain "due to MS that is worse today." Per EMS, pt was able to ambulate to stretcher.

## 2022-04-17 NOTE — ED Provider Triage Note (Signed)
Emergency Medicine Provider Triage Evaluation Note  Shane Ramirez , a 36 y.o. male  was evaluated in triage.  Pt complains of bilateral leg pain. Was seen at [facility] ER several hours ago and was discharged with homelessness resources. Reports pain related to MS.   This is patient's 25th ER visit for similar symptoms since 6/6  Review of Systems  Positive: Bilateral leg pain Negative: fever  Physical Exam  BP 120/71   Pulse (!) 106   Resp 18   SpO2 97%  Gen:   Awake, no distress   Resp:  Normal effort  MSK:   Moves extremities without difficulty  Other:    Medical Decision Making  Medically screening exam initiated at 2:06 PM.  Appropriate orders placed.  Shane Ramirez was informed that the remainder of the evaluation will be completed by another provider, this initial triage assessment does not replace that evaluation, and the importance of remaining in the ED until their evaluation is complete.     Lindsi Bayliss T, PA-C 04/17/22 1406

## 2022-04-17 NOTE — ED Provider Notes (Signed)
[facility]EMERG[facility]Y DEPT Provider Note   CSN: [id number] Arrival date & time: 04/17/22  2952     History No chief complaint on file.   Olyver Hawes is a 36 y.o. male with history of MS presents the emergency department for evaluation of persistent bilateral lower leg numbness ongoing for over a year.  Patient reports that this is unchanged.  Denies any urinary or fecal incontinence.  No new injury.  He has a history of multiple sclerosis but is not currently seeking any treatment or is followed up with any of the resources he has been given.  He has been seen multiple times in the past week for the same complaint.  Patient does not want to elaborate on the problem more.  HPI     Home Medications Prior to Admission medications   Medication Sig Start Date End Date Taking? Authorizing Provider  escitalopram (LEXAPRO) 10 MG tablet Take 1 tablet (10 mg total) by mouth daily. Patient not taking: Reported on 04/05/2022 03/31/22 03/31/23  Rolly Salter, MD  ibuprofen (ADVIL) 200 MG tablet Take 200-400 mg by mouth every 6 (six) hours as needed for mild pain or headache.    [provider]      Allergies    Patient has no known allergies.    Review of Systems   Review of Systems  Musculoskeletal:  Negative for back pain.  Neurological:  Positive for numbness. Negative for weakness.    Physical Exam Updated Vital Signs BP 123/73 (BP Location: Right Arm)   Pulse 78   Temp 98.8 F (37.1 C) (Oral)   Resp 18   SpO2 94%  Physical Exam Vitals and nursing note reviewed.  Constitutional:      Appearance: Normal appearance.  Eyes:     General: No scleral icterus. Pulmonary:     Effort: Pulmonary effort is normal. No respiratory distress.  Musculoskeletal:     Comments: Sensation intact bilaterally.  Strength equal bilaterally in lower extremities.  Palpable pulses.  Compartments are soft.  Skin:    General: Skin is dry.     Findings: No rash.   Neurological:     General: No focal deficit present.     Mental Status: He is alert. Mental status is at baseline.  Psychiatric:        Mood and Affect: Mood normal.     ED Results / Procedures / Treatments   Labs (all labs ordered are listed, but only abnormal results are displayed) Labs Reviewed - No data to display  EKG None  Radiology No results found.  Procedures Procedures   Medications Ordered in ED Medications - No data to display  ED Course/ Medical Decision Making/ A&P                           Medical Decision Making   36 year old male presents the emergency department for evaluation of persistent bilateral lower extremity numbness that is been going on for over a year.  Vital signs are stable.  Physical exam shows equal strength bilaterally.  Sensation is intact bilaterally.  Palpable pulses.  Compartments are soft.  Patient is ambulatory with his walker at baseline.  The patient has been seen multiple times over the past few days including twice on July 2, once on the first, 3 times on 22 June, twice on June 21.  He has had 24 visits to the emergency department for this problem since 03/20/2022.  From looking at previous charts, physical exam is unchanged.  Discharge patient home.  Recommended follow-up with primary care and neurology.  Gave him resources to come to the wellness.  Yesterday, patient was given homelessness resources as well.  We discussed return precautions red flag symptoms.  Patient verbalizes understanding and agrees to the plan.  Patient is stable to be discharged home in good condition.  Final Clinical Impression(s) / ED Diagnoses Final diagnoses:  Numbness  Homelessness    Rx / DC Orders ED Discharge Orders     None         Achille Rich, Cordelia Poche 04/17/22 1604    Cheryll Cockayne, MD 04/28/22 2313

## 2022-04-17 NOTE — Discharge Instructions (Addendum)
Go to the behavioral health urgent care center now if you would like to be seen.  They can evaluate you for your mental health.  Please follow-up with your family doctor for your pain in your feet.

## 2022-04-17 NOTE — Discharge Instructions (Addendum)
Please follow up with the homeless resources and neurology. I have listed a primary care clinic to this paperwork as well. If you have any concern, new or worsening symptoms, please return to the ER.   Contact a health care provider if: You feel depressed. You develop new pain or numbness. You have tremors. You have problems with sexual function. Get help right away if: You develop paralysis. You develop numbness. You have problems with your bladder or bowel function. You develop double vision. You lose vision in one or both eyes. You develop suicidal thoughts. You develop severe confusion. Get help right away if you feel like you may hurt yourself or others, or have thoughts about taking your own life. Go to your nearest emergency room or: Call 911. Call the National Suicide Prevention Lifeline at 201-460-0101 or 988. This is open 24 hours a day. Text the Crisis Text Line at (431)412-8149.

## 2022-04-17 NOTE — ED Triage Notes (Signed)
Pt arrived via EMS, from street, bilateral leg pain for over a year.

## 2022-04-17 NOTE — ED Notes (Signed)
Opened chart to answer questions for Shane Ramirez [facility] regarding discharge instructions.

## 2022-04-18 ENCOUNTER — Encounter (HOSPITAL_COMMUNITY): Payer: Self-pay

## 2022-04-18 ENCOUNTER — Emergency Department (HOSPITAL_COMMUNITY)
Admission: EM | Admit: 2022-04-18 | Discharge: 2022-04-18 | Disposition: A | Discharge status: Home / Self Care | Payer: Self-pay | Attending: Emergency Medicine | Admitting: Emergency Medicine

## 2022-04-18 ENCOUNTER — Other Ambulatory Visit: Payer: Self-pay

## 2022-04-18 ENCOUNTER — Encounter (HOSPITAL_COMMUNITY): Payer: Self-pay | Admitting: Emergency Medicine

## 2022-04-18 DIAGNOSIS — R200 Anesthesia of skin: Secondary | ICD-10-CM | POA: Insufficient documentation

## 2022-04-18 DIAGNOSIS — Z5900 Homelessness unspecified: Secondary | ICD-10-CM | POA: Insufficient documentation

## 2022-04-18 DIAGNOSIS — R531 Weakness: Secondary | ICD-10-CM | POA: Insufficient documentation

## 2022-04-18 DIAGNOSIS — R202 Paresthesia of skin: Secondary | ICD-10-CM | POA: Insufficient documentation

## 2022-04-18 DIAGNOSIS — W19XXXA Unspecified fall, initial encounter: Secondary | ICD-10-CM | POA: Insufficient documentation

## 2022-04-18 NOTE — ED Notes (Signed)
Reviewed discharge instructions with patient. Follow-up care reviewed. Patient verbalized understanding. Patient A&Ox4, VSS, and ambulatory with steady gait upon discharge.  

## 2022-04-18 NOTE — ED Provider Notes (Signed)
[facility] EMERGENCY DEPARTMENT Provider Note   CSN: [id number] Arrival date & time: 04/18/22  1315     History  No chief complaint on file.   Shane Ramirez is a 36 y.o. male.  Pt is a 36  yo male with pmhx significant for MS and homeless status.  Pt has been here nearly every day recently and was here earlier today.  Pt complains of numbness in his legs which appears to be a chronic issue.  His main issue is his homeless status.  He said he does not have a shelter.  MRIs most recently done on 6/14 and showed no exacerbation of MS.       Home Medications Prior to Admission medications   Medication Sig Start Date End Date Taking? Authorizing Provider  escitalopram (LEXAPRO) 10 MG tablet Take 1 tablet (10 mg total) by mouth daily. Patient not taking: Reported on 04/05/2022 03/31/22 03/31/23  Rolly Salter, MD  ibuprofen (ADVIL) 200 MG tablet Take 200-400 mg by mouth every 6 (six) hours as needed for mild pain or headache.    [provider]      Allergies    Patient has no known allergies.    Review of Systems   Review of Systems  Neurological:  Positive for numbness.  All other systems reviewed and are negative.   Physical Exam Updated Vital Signs BP 127/72 (BP Location: Left Arm)   Pulse 69   Temp 98.5 F (36.9 C) (Oral)   Resp 16   SpO2 99%  Physical Exam Vitals and nursing note reviewed.  Constitutional:      Appearance: Normal appearance.  HENT:     Head: Normocephalic and atraumatic.     Right Ear: External ear normal.     Left Ear: External ear normal.     Nose: Nose normal.     Mouth/Throat:     Mouth: Mucous membranes are moist.     Pharynx: Oropharynx is clear.  Eyes:     Extraocular Movements: Extraocular movements intact.     Pupils: Pupils are equal, round, and reactive to light.  Cardiovascular:     Rate and Rhythm: Normal rate and regular rhythm.     Pulses: Normal pulses.     Heart sounds: Normal heart sounds.   Pulmonary:     Effort: Pulmonary effort is normal.     Breath sounds: Normal breath sounds.  Abdominal:     General: Abdomen is flat. Bowel sounds are normal.     Palpations: Abdomen is soft.  Musculoskeletal:        General: Normal range of motion.     Cervical back: Normal range of motion and neck supple.  Skin:    General: Skin is warm.     Capillary Refill: Capillary refill takes less than 2 seconds.  Neurological:     Mental Status: He is alert and oriented to person, place, and time.     Comments: Chronic numbness ble.  Pt is able to ambulate.  Psychiatric:        Mood and Affect: Mood normal.        Behavior: Behavior normal.     ED Results / Procedures / Treatments   Labs (all labs ordered are listed, but only abnormal results are displayed) Labs Reviewed - No data to display  EKG None  Radiology No results found.  Procedures Procedures    Medications Ordered in ED Medications - No data to display  ED Course/  Medical Decision Making/ A&P                           Medical Decision Making  This patient presents to the ED for concern of numbness, this involves an extensive number of treatment options, and is a complaint that carries with it a high risk of complications and morbidity.  The differential diagnosis includes ms flare, homeless   Co morbidities that complicate the patient evaluation  Ms, homeless   Additional history obtained:  Additional history obtained from epic chart review  Test Considered:  Mri, but it's recently been done   Critical Interventions:  sw   Consultations Obtained:  I requested consultation with the sw,  and discussed lab and imaging findings as well as pertinent plan - they will help with shelter placement   Problem List / ED Course:  Homeless:  sw to help with shelters Leg numbness:  chronic issue   Reevaluation:  After the interventions noted above, I reevaluated the patient and found that they have  :stayed the same   Social Determinants of Health:  homeless   Dispostion:  After consideration of the diagnostic results and the patients response to treatment, I feel that the patent would benefit from discharge with outpatient f/u.          Final Clinical Impression(s) / ED Diagnoses Final diagnoses:  Homeless    Rx / DC Orders ED Discharge Orders     None         Jacalyn Lefevre, MD 04/18/22 1726

## 2022-04-18 NOTE — ED Triage Notes (Signed)
Pt BIB [facility] with c/o generalized weakness x 3 days. Denies pain. Pt seen earlier today.

## 2022-04-18 NOTE — Care Management (Signed)
Patient states he is unable to ambulate without a walker, RNCM provided patient with a walker for discharge. Also, provided information for [facility] for f/u and homeless resources provided as well.

## 2022-04-18 NOTE — ED Provider Triage Note (Signed)
Emergency Medicine Provider Triage Evaluation Note  Shane Ramirez , a 36 y.o. male  was evaluated in triage.  Pt complains of leg pain. States because of his MS. Denies injuries.  Review of Systems  Positive: Leg pain Negative: Chest pain  Physical Exam  There were no vitals taken for this visit. Gen:   Awake, no distress   Resp:  Normal effort  MSK:   Moves extremities without difficulty  Other:  Moving legs  Medical Decision Making  Medically screening exam initiated at 1:23 PM.  Appropriate orders placed.  Oswaldo Cueto was informed that the remainder of the evaluation will be completed by another provider, this initial triage assessment does not replace that evaluation, and the importance of remaining in the ED until their evaluation is complete.     Dietrich Pates, PA-C 04/18/22 1325

## 2022-04-18 NOTE — ED Triage Notes (Signed)
[place] [facility] [place] d/t fall caused by MS flare up. Pt was seen for the same yesterday. No injuries noted. Denied hitting head, no LOC. Was able to ambulate to stretcher. A&O X4. VSS.

## 2022-04-18 NOTE — ED Triage Notes (Addendum)
Patient BIB [facility] from community for evaluation of "being unable to feel his legs". Patient states he was trying to get onto the floor and his lower back started hurting after falling, history of multiple sclerosis. Patient seen for same six times since 7/1.

## 2022-04-18 NOTE — ED Provider Notes (Addendum)
[facility] [place] [facility]-EMERGENCY DEPT Provider Note   CSN: [id number] Arrival date & time: 04/18/22  2029     History  Chief Complaint  Patient presents with   Weakness    Martine Trageser is a 36 y.o. male.  HPI He presents for evaluation of decreased sensation in legs.  This is his third ED evaluation today for the same problem.  He is homeless.  He had exacerbation of MS which was treated as an inpatient about 3 weeks ago.  He has not followed up with neurology, since that time.    Home Medications Prior to Admission medications   Medication Sig Start Date End Date Taking? Authorizing Provider  escitalopram (LEXAPRO) 10 MG tablet Take 1 tablet (10 mg total) by mouth daily. Patient not taking: Reported on 04/05/2022 03/31/22 03/31/23  Rolly Salter, MD  ibuprofen (ADVIL) 200 MG tablet Take 200-400 mg by mouth every 6 (six) hours as needed for mild pain or headache.    [provider]      Allergies    Patient has no known allergies.    Review of Systems   Review of Systems  Physical Exam Updated Vital Signs BP 107/65 (BP Location: Left Arm)   Pulse 75   Temp 98.5 F (36.9 C) (Oral)   Resp 17   SpO2 95%  Physical Exam Vitals and nursing note reviewed.  Constitutional:      General: He is not in acute distress.    Appearance: He is well-developed. He is not ill-appearing.  HENT:     Head: Normocephalic and atraumatic.     Right Ear: External ear normal.     Left Ear: External ear normal.  Eyes:     Conjunctiva/sclera: Conjunctivae normal.     Pupils: Pupils are equal, round, and reactive to light.  Neck:     Trachea: Phonation normal.  Cardiovascular:     Rate and Rhythm: Normal rate.  Pulmonary:     Effort: Pulmonary effort is normal.  Abdominal:     General: There is no distension.  Musculoskeletal:        General: Normal range of motion.     Cervical back: Normal range of motion and neck supple.  Skin:    General: Skin is warm and  dry.  Neurological:     Mental Status: He is alert and oriented to person, place, and time.     Cranial Nerves: No cranial nerve deficit.     Sensory: No sensory deficit.     Motor: No abnormal muscle tone.     Coordination: Coordination normal.     Comments: No dysarthria or aphasia.  He is able to sense touch on fingers and legs bilaterally.  Psychiatric:        Mood and Affect: Mood normal.        Behavior: Behavior normal.        Thought Content: Thought content normal.        Judgment: Judgment normal.     ED Results / Procedures / Treatments   Labs (all labs ordered are listed, but only abnormal results are displayed) Labs Reviewed - No data to display  EKG None  Radiology No results found.  Procedures Procedures    Medications Ordered in ED Medications - No data to display  ED Course/ Medical Decision Making/ A&P  Medical Decision Making Patient with numerous ED visits since last hospitalization.  He is homeless.  He has no criteria for admission to the hospital.  Problems Addressed: Paresthesia: self-limited or minor problem  Risk Decision regarding hospitalization. Risk Details: Follow-up with neurology as outpatient           Final Clinical Impression(s) / ED Diagnoses Final diagnoses:  Paresthesia  Homelessness    Rx / DC Orders ED Discharge Orders     None         Mancel Bale, MD 04/18/22 2101  Patient eating at this time.  He states he is not seeing the neurologist that he was referred to yet.  I encouraged him to do that.   Mancel Bale, MD 04/18/22 2123

## 2022-04-18 NOTE — ED Notes (Signed)
An After Visit Summary was printed and given to the patient. Discharge instructions given and no further questions at this time.  

## 2022-04-18 NOTE — Discharge Instructions (Signed)
Please access further medical care and housing using the resource guide you are provided There is no evidence of an injury or abnormal neurological exam on your evaluation today. You may return if you have any new problems or new injuries

## 2022-04-18 NOTE — ED Notes (Signed)
Pt irritable when this RN went to go over his discharge instructions. Pt stated aggressively, "Did you find my walker?" Explained to pt that I know nothing about his walker and that I would talk to the doctor. Pt reports he had it with him on the ambulance. I asked pt if he saw EMS bring it in and he said no. I explained that it may be on the truck then. Pt is now stating that we lost his walker. CM has a walker to give to the pt. Pt is still very irritable and talking w/ an aggressive tone.

## 2022-04-18 NOTE — Discharge Instructions (Signed)
There does not appear to be an exacerbation of your multiple sclerosis at this time.  Since you are having ongoing decreased sensation of your legs, follow-up with the neurologist as soon as possible.  You need to call him to get the appointment.

## 2022-04-18 NOTE — ED Provider Notes (Signed)
[facility] EMERGENCY DEPARTMENT Provider Note   CSN: [id number] Arrival date & time: 04/18/22  3016     History  Chief Complaint  Patient presents with   Shane Ramirez is a 36 y.o. male.  HPI Patient reports that he fell downtown today.  He states he fell because his legs are numb.  He denies any weakness.  States he has MS and this is why he fell.  He denies any injury.  He is homeless and staying downtown in [place].  He denies any alcohol or substance abuse.  He denies any headache, neck pain, chest pain, dyspnea, abdominal pain     Home Medications Prior to Admission medications   Medication Sig Start Date End Date Taking? Authorizing Provider  escitalopram (LEXAPRO) 10 MG tablet Take 1 tablet (10 mg total) by mouth daily. Patient not taking: Reported on 04/05/2022 03/31/22 03/31/23  Rolly Salter, MD  ibuprofen (ADVIL) 200 MG tablet Take 200-400 mg by mouth every 6 (six) hours as needed for mild pain or headache.    [provider]      Allergies    Patient has no known allergies.    Review of Systems   Review of Systems  Physical Exam Updated Vital Signs BP 111/66   Pulse 70   Temp 97.8 F (36.6 C) (Oral)   Resp 17   SpO2 98%  Physical Exam Vitals reviewed.  Constitutional:      General: He is not in acute distress.    Appearance: Normal appearance.  HENT:     Head: Normocephalic and atraumatic.     Right Ear: External ear normal.     Left Ear: External ear normal.     Nose: Nose normal.     Mouth/Throat:     Pharynx: Oropharynx is clear.  Eyes:     Pupils: Pupils are equal, round, and reactive to light.  Cardiovascular:     Rate and Rhythm: Normal rate and regular rhythm.     Pulses: Normal pulses.  Pulmonary:     Effort: Pulmonary effort is normal.     Breath sounds: Normal breath sounds.  Abdominal:     Palpations: Abdomen is soft.  Musculoskeletal:        General: No swelling, tenderness or deformity.  Normal range of motion.     Cervical back: Normal range of motion.     Right lower leg: No edema.  Skin:    General: Skin is warm and dry.     Capillary Refill: Capillary refill takes less than 2 seconds.     Findings: No bruising.  Neurological:     General: No focal deficit present.     Mental Status: He is alert and oriented to person, place, and time.     Cranial Nerves: No cranial nerve deficit.     Sensory: No sensory deficit.     Motor: No weakness.     Coordination: Coordination normal.  Psychiatric:        Mood and Affect: Affect is labile and angry.     ED Results / Procedures / Treatments   Labs (all labs ordered are listed, but only abnormal results are displayed) Labs Reviewed - No data to display  EKG None  Radiology No results found.  Procedures Procedures    Medications Ordered in ED Medications - No data to display  ED Course/ Medical Decision Making/ A&P  Medical Decision Making 36 year old male who presents today complaining of fall secondary to decree sensation in his feet.  On his neurological exam he has good sensation throughout his lower extremities and perineal area.  He has strength that is equal and 5 out of 5 throughout bilateral and upper extremities.  Has no decrease in strength or balance normal exam. He has no injuries and no complaints of injuries. Patient appears stable for discharge           Final Clinical Impression(s) / ED Diagnoses Final diagnoses:  Fall, initial encounter    Rx / DC Orders ED Discharge Orders     None         Margarita Grizzle, MD 04/18/22 0830

## 2022-04-20 ENCOUNTER — Emergency Department (HOSPITAL_COMMUNITY)
Admission: EM | Admit: 2022-04-20 | Discharge: 2022-04-20 | Disposition: A | Discharge status: Home / Self Care | Payer: Self-pay | Attending: Emergency Medicine | Admitting: Emergency Medicine

## 2022-04-20 ENCOUNTER — Encounter (HOSPITAL_COMMUNITY): Payer: Self-pay

## 2022-04-20 ENCOUNTER — Other Ambulatory Visit: Payer: Self-pay

## 2022-04-20 ENCOUNTER — Encounter (HOSPITAL_COMMUNITY): Payer: Self-pay | Admitting: Emergency Medicine

## 2022-04-20 DIAGNOSIS — M79604 Pain in right leg: Secondary | ICD-10-CM | POA: Insufficient documentation

## 2022-04-20 DIAGNOSIS — R200 Anesthesia of skin: Secondary | ICD-10-CM | POA: Insufficient documentation

## 2022-04-20 DIAGNOSIS — Z765 Malingerer [conscious simulation]: Secondary | ICD-10-CM

## 2022-04-20 DIAGNOSIS — Z5900 Homelessness unspecified: Secondary | ICD-10-CM | POA: Insufficient documentation

## 2022-04-20 DIAGNOSIS — M79605 Pain in left leg: Secondary | ICD-10-CM | POA: Insufficient documentation

## 2022-04-20 DIAGNOSIS — R209 Unspecified disturbances of skin sensation: Secondary | ICD-10-CM | POA: Insufficient documentation

## 2022-04-20 NOTE — ED Triage Notes (Addendum)
Pt BIB [facility] c/o bilateral leg pain and numbness today. Pt was just here on 7/5 for the same thing. Pt able to ambulate around triage and to the bathroom with no issues.

## 2022-04-20 NOTE — ED Provider Notes (Signed)
[facility] EMERGENCY DEPARTMENT Provider Note   CSN: [id number] Arrival date & time: 04/20/22  1058     History Chief Complaint  Patient presents with   Leg Pain    Shane Ramirez is a 36 y.o. male with MS experiencing homelessness presents to the ED for evaluation of unchanged bilateral leg numbness for over a year. The patient reports it is unchanged from his past visits. The patient reports he doesn't have a walker as he left it on the back of an ambulance. He denies any fever, fecal, or urinary incontinence. The patient has not followed up with neurology.    Leg Pain      Home Medications Prior to Admission medications   Medication Sig Start Date End Date Taking? Authorizing Provider  escitalopram (LEXAPRO) 10 MG tablet Take 1 tablet (10 mg total) by mouth daily. Patient not taking: Reported on 04/05/2022 03/31/22 03/31/23  Rolly Salter, MD  ibuprofen (ADVIL) 200 MG tablet Take 200-400 mg by mouth every 6 (six) hours as needed for mild pain or headache.    [provider]      Allergies    Patient has no known allergies.    Review of Systems   Review of Systems  Neurological:  Positive for numbness.    Physical Exam Updated Vital Signs BP 111/74 (BP Location: Right Arm)   Pulse 77   Temp 98.7 F (37.1 C) (Oral)   Resp 16   Ht 6\' 3"  (1.905 m)   Wt 104.3 kg   SpO2 95%   BMI 28.75 kg/m  Physical Exam Vitals and nursing note reviewed.  Constitutional:      Appearance: Normal appearance.  Eyes:     General: No scleral icterus. Pulmonary:     Effort: Pulmonary effort is normal. No respiratory distress.  Musculoskeletal:     Right lower leg: No edema.     Left lower leg: No edema.     Comments: Sensation intact to soft and deep pressure of the bilateral lower extremities. Palpable pulses, compartments are soft. He is moving all extremities. Ambulatory with walker.   Skin:    General: Skin is dry.     Findings: No rash.   Neurological:     General: No focal deficit present.     Mental Status: He is alert. Mental status is at baseline.  Psychiatric:        Mood and Affect: Mood normal.     ED Results / Procedures / Treatments   Labs (all labs ordered are listed, but only abnormal results are displayed) Labs Reviewed - No data to display  EKG None  Radiology No results found.  Procedures Procedures   Medications Ordered in ED Medications - No data to display  ED Course/ Medical Decision Making/ A&P                           Medical Decision Making   36 y/o M presents to the ED for evaluation of constant, unchanged bilateral leg numbness. Differential diagnosis includes MS flare, chronic MS, malingering. Vitals are unremarkable. The patient has palpable pulses and has sensation to both light and deep touch. Usually ambulatory with walker. The patient has 28 visits in a little over a month, some with up to 3 visits a day. The patient denies any new or worsening symptoms. Considered MRI imaging, but the patient has had this recently. Treated for MS flare with steroids in  early June. This does not seem to be an acute problem given patient's multiple visits without follow up care. He was treated with steroids He does not want to follow up with a neurologist, I encouraged him to do so. Shelter resources and return precautions given. The patient is stable and being discharged home in good condition.    Final Clinical Impression(s) / ED Diagnoses Final diagnoses:  Numbness  Homelessness    Rx / DC Orders ED Discharge Orders     None         Achille Rich, Cordelia Poche 04/20/22 1729    Jacalyn Lefevre, MD 04/20/22 1905

## 2022-04-20 NOTE — ED Notes (Signed)
Pt able to ambulate prior to discharge.

## 2022-04-20 NOTE — ED Provider Notes (Signed)
  [facility] EMERGENCY DEPARTMENT Provider Note   CSN: [id number] Arrival date & time: 04/20/22  2009     History  Chief Complaint  Patient presents with   Numbness    Shane Ramirez is a 36 y.o. male patient has 29 visits in the last 6 months.  He has a past medical history of MS.  He was seen and evaluated and discharged 3 hours ago.  He is complaining of leg numbness.  This is old.  He has no new complaints and no new emergency conditions.  HPI     Home Medications Prior to Admission medications   Medication Sig Start Date End Date Taking? Authorizing Provider  escitalopram (LEXAPRO) 10 MG tablet Take 1 tablet (10 mg total) by mouth daily. Patient not taking: Reported on 04/05/2022 03/31/22 03/31/23  Rolly Salter, MD  ibuprofen (ADVIL) 200 MG tablet Take 200-400 mg by mouth every 6 (six) hours as needed for mild pain or headache.    [provider]      Allergies    Patient has no known allergies.    Review of Systems   Review of Systems  Physical Exam Updated Vital Signs BP 122/68   Pulse 70   Temp 98.4 F (36.9 C) (Oral)   Resp 16   SpO2 100%  Physical Exam Physical Exam  Nursing note and vitals reviewed. Constitutional: He appears well-developed and well-nourished. No distress.  HENT:  Head: Normocephalic and atraumatic.  Eyes: Conjunctivae normal are normal. No scleral icterus.  Neck: Normal range of motion. Neck supple.  Cardiovascular: Normal rate, regular rhythm and normal heart sounds.   Pulmonary/Chest: Effort normal and breath sounds normal. No respiratory distress.  Abdominal: Soft. There is no tenderness.  Musculoskeletal: He exhibits no edema.  Neurological: He is alert.  Skin: Skin is warm and dry. He is not diaphoretic.  Psychiatric: His behavior is normal.   ED Results / Procedures / Treatments   Labs (all labs ordered are listed, but only abnormal results are displayed) Labs Reviewed - No data to  display  EKG None  Radiology No results found.  Procedures Procedures    Medications Ordered in ED Medications - No data to display  ED Course/ Medical Decision Making/ A&P                           Medical Decision Making Problems Addressed: Malingering: chronic illness or injury  Patient advised to use the emergency department only for emergency conditions.  He has no new complaints.  Will be discharged at this time.        Final Clinical Impression(s) / ED Diagnoses Final diagnoses:  Malingering    Rx / DC Orders ED Discharge Orders     None         Arthor Captain, PA-C 04/20/22 2018    Cheryll Cockayne, MD 04/28/22 2315

## 2022-04-20 NOTE — ED Triage Notes (Signed)
Pt c/o bilateral leg numbness/pain x 1 year. Pt seen today for same.

## 2022-04-20 NOTE — Discharge Instructions (Addendum)
You were seen in the ER for evaluation of your leg numbness. Please follow up with a neurologist. Use your walker to ambulate.   Contact a health care provider if: You feel depressed. You develop new pain or numbness. You have tremors. You have problems with sexual function. Get help right away if: You develop paralysis. You develop numbness. You have problems with your bladder or bowel function. You develop double vision. You lose vision in one or both eyes. You develop suicidal thoughts. You develop severe confusion. Get help right away if you feel like you may hurt yourself or others, or have thoughts about taking your own life. Go to your nearest emergency room or: Call 911. Call the National Suicide Prevention Lifeline at 2057933949 or 988. This is open 24 hours a day. Text the Crisis Text Line at (725) 748-1307.

## 2022-04-20 NOTE — ED Provider Triage Note (Signed)
Emergency Medicine Provider Triage Evaluation Note  Fortino Haag , a 36 y.o. male  was evaluated in triage.  Pt complains of leg numbness. Pt is homeless with hx of MS.  Here with bilateral leg pain and numbness today.  Seen for this previously.  Currently denies taking any meds for it.   Review of Systems  Positive: As above Negative: As above  Physical Exam  BP 111/74 (BP Location: Right Arm)   Pulse 77   Temp 98.7 F (37.1 C) (Oral)   Resp 16   Ht 6\' 3"  (1.905 m)   Wt 104.3 kg   SpO2 95%   BMI 28.75 kg/m  Gen:   Awake, no distress   Resp:  Normal effort  MSK:   Moves extremities without difficulty  Other:    Medical Decision Making  Medically screening exam initiated at 11:42 AM.  Appropriate orders placed.  Harpreet Pompey was informed that the remainder of the evaluation will be completed by another provider, this initial triage assessment does not replace that evaluation, and the importance of remaining in the ED until their evaluation is complete.  Has been seen several times for paresthesia related to MS, but repot not taking any medications for it or f/u with neurology   Letitia Neri, PA-C 04/20/22 1145

## 2022-04-20 NOTE — ED Notes (Signed)
Pt verbalizes understanding of discharge instructions. Opportunity for questions and answers were provided. Pt discharged from the ED.   ?

## 2022-04-20 NOTE — Discharge Instructions (Addendum)
Please use the emergency department for emergencies only.  Return for any emergency condition you may have

## 2022-04-21 ENCOUNTER — Other Ambulatory Visit: Payer: Self-pay

## 2022-04-21 ENCOUNTER — Emergency Department (HOSPITAL_COMMUNITY)
Admission: EM | Admit: 2022-04-21 | Discharge: 2022-04-21 | Discharge status: Against Medical Advice | Payer: Self-pay | Attending: Emergency Medicine | Admitting: Emergency Medicine

## 2022-04-21 ENCOUNTER — Encounter (HOSPITAL_COMMUNITY): Payer: Self-pay

## 2022-04-21 ENCOUNTER — Emergency Department (HOSPITAL_COMMUNITY)
Admission: EM | Admit: 2022-04-21 | Discharge: 2022-04-21 | Disposition: A | Discharge status: Home / Self Care | Payer: Self-pay | Attending: Emergency Medicine | Admitting: Emergency Medicine

## 2022-04-21 DIAGNOSIS — M79605 Pain in left leg: Secondary | ICD-10-CM | POA: Insufficient documentation

## 2022-04-21 DIAGNOSIS — R200 Anesthesia of skin: Secondary | ICD-10-CM | POA: Insufficient documentation

## 2022-04-21 DIAGNOSIS — G8929 Other chronic pain: Secondary | ICD-10-CM | POA: Insufficient documentation

## 2022-04-21 DIAGNOSIS — M79661 Pain in right lower leg: Secondary | ICD-10-CM | POA: Insufficient documentation

## 2022-04-21 DIAGNOSIS — M79604 Pain in right leg: Secondary | ICD-10-CM | POA: Insufficient documentation

## 2022-04-21 DIAGNOSIS — Z5321 Procedure and treatment not carried out due to patient leaving prior to being seen by health care provider: Secondary | ICD-10-CM | POA: Insufficient documentation

## 2022-04-21 DIAGNOSIS — M79662 Pain in left lower leg: Secondary | ICD-10-CM | POA: Insufficient documentation

## 2022-04-21 NOTE — ED Triage Notes (Addendum)
Pt Bib EMS, c/o chronic bilateral leg pain and numbness. Pt c/o being hungry. Pt seen here earlier today for same.  Pt has hx of MS.

## 2022-04-21 NOTE — ED Triage Notes (Signed)
Pt BIB [facility], c/o bilateral leg pain and numbness. Seen here x 2 today for same.

## 2022-04-21 NOTE — ED Triage Notes (Signed)
This is patients 3rd visit to this ED today. Pt c/o chronic leg numbness and wanting something to eat.

## 2022-04-21 NOTE — Discharge Instructions (Signed)
Please call and follow-up with neurology.

## 2022-04-21 NOTE — ED Provider Notes (Signed)
[facility] EMERGENCY DEPARTMENT Provider Note   CSN: [id number] Arrival date & time: 04/21/22  0018     History  Chief Complaint  Patient presents with   Leg Pain    Bowen Kia is a 36 y.o. male.  36 year old male with history of MS presents today for evaluation of bilateral leg numbness.  This is patient's 30th visit in the past 6 months.  Patient was just evaluated yesterday for same complaints and discharge.  No new complaints.  Patient has a walker that he ambulates with.  Patient was previously discussed with neurology as well and he completed his steroid course.    The history is provided by the patient. No language interpreter was used.       Home Medications Prior to Admission medications   Medication Sig Start Date End Date Taking? Authorizing Provider  escitalopram (LEXAPRO) 10 MG tablet Take 1 tablet (10 mg total) by mouth daily. Patient not taking: Reported on 04/05/2022 03/31/22 03/31/23  Rolly Salter, MD  ibuprofen (ADVIL) 200 MG tablet Take 200-400 mg by mouth every 6 (six) hours as needed for mild pain or headache.    [provider]      Allergies    Patient has no known allergies.    Review of Systems   Review of Systems  Constitutional:  Negative for chills and fever.  Eyes:  Negative for visual disturbance.  Respiratory:  Negative for shortness of breath.   Neurological:  Positive for numbness. Negative for weakness.  All other systems reviewed and are negative.   Physical Exam Updated Vital Signs BP 121/72   Pulse 73   Temp 98.1 F (36.7 C) (Oral)   Resp 16   SpO2 96%  Physical Exam Vitals and nursing note reviewed.  Constitutional:      General: He is not in acute distress.    Appearance: Normal appearance. He is not ill-appearing.  HENT:     Head: Normocephalic and atraumatic.     Nose: Nose normal.  Eyes:     Conjunctiva/sclera: Conjunctivae normal.  Pulmonary:     Effort: Pulmonary effort is normal.  No respiratory distress.  Musculoskeletal:        General: No deformity.  Skin:    Findings: No rash.  Neurological:     Mental Status: He is alert.    ED Results / Procedures / Treatments   Labs (all labs ordered are listed, but only abnormal results are displayed) Labs Reviewed - No data to display  EKG None  Radiology No results found.  Procedures Procedures    Medications Ordered in ED Medications - No data to display  ED Course/ Medical Decision Making/ A&P                           Medical Decision Making  36 year old male presents today for evaluation of leg numbness.  This is a chronic issue given his MS history.  This is patient's 30th visit in the past 6 months.  Denies any new complaints.  Has full range of motion of bilateral lower extremities.  Previously was admitted and received IV steroids, and MRI imaging.  MRI imaging does not need to be repeated at this time.  Patient is appropriate for discharge.  Discharged in stable condition.  Component of malingering.  Provided patient with neurology information so he could establish and follow-up with them.   Final Clinical Impression(s) / ED Diagnoses Final  diagnoses:  Leg numbness    Rx / DC Orders ED Discharge Orders     None         Marita Kansas, PA-C 04/21/22 0257    Melene Plan, DO 04/21/22 0330

## 2022-04-21 NOTE — ED Notes (Signed)
Pt removed from building .  Outside sunbathing on grass.

## 2022-04-22 ENCOUNTER — Emergency Department (HOSPITAL_COMMUNITY)
Admission: EM | Admit: 2022-04-22 | Discharge: 2022-04-22 | Disposition: A | Discharge status: Home / Self Care | Payer: Self-pay | Attending: Emergency Medicine | Admitting: Emergency Medicine

## 2022-04-22 ENCOUNTER — Encounter (HOSPITAL_COMMUNITY): Payer: Self-pay

## 2022-04-22 ENCOUNTER — Emergency Department (HOSPITAL_COMMUNITY)
Admission: EM | Admit: 2022-04-22 | Discharge: 2022-04-23 | Disposition: A | Discharge status: Home / Self Care | Payer: Self-pay | Attending: Emergency Medicine | Admitting: Emergency Medicine

## 2022-04-22 ENCOUNTER — Other Ambulatory Visit: Payer: Self-pay

## 2022-04-22 ENCOUNTER — Emergency Department (HOSPITAL_COMMUNITY)
Admission: EM | Admit: 2022-04-22 | Discharge: 2022-04-22 | Disposition: A | Discharge status: Home / Self Care | Payer: Self-pay | Attending: Student | Admitting: Student

## 2022-04-22 DIAGNOSIS — M79671 Pain in right foot: Secondary | ICD-10-CM | POA: Insufficient documentation

## 2022-04-22 DIAGNOSIS — R202 Paresthesia of skin: Secondary | ICD-10-CM | POA: Insufficient documentation

## 2022-04-22 DIAGNOSIS — M79672 Pain in left foot: Secondary | ICD-10-CM | POA: Insufficient documentation

## 2022-04-22 DIAGNOSIS — R200 Anesthesia of skin: Secondary | ICD-10-CM | POA: Insufficient documentation

## 2022-04-22 DIAGNOSIS — G8929 Other chronic pain: Secondary | ICD-10-CM | POA: Insufficient documentation

## 2022-04-22 MED ORDER — ACETAMINOPHEN 325 MG PO TABS
650.0000 mg | ORAL_TABLET | Freq: Once | ORAL | Status: DC
  Filled 2022-04-22: qty 2

## 2022-04-22 NOTE — ED Provider Notes (Signed)
[facility]-EMERGENCY DEPT Provider Note   CSN: [id number] Arrival date & time: 04/22/22  1112     History  Chief Complaint  Patient presents with   Leg Pain    Shane Ramirez is a 36 y.o. male.  Patient here with pain in his feet, numbness.  History of MS, homelessness.  Denies any chest pain, weakness, shortness of breath, abdominal pain, nausea, vomiting.  He is not happy with homeless resources locally.  He has stated shelters at times in the past but he mostly lives in the street.  He was recently admitted for steroids for his MS.  Nothing makes it worse or better.  He is able to ambulate fairly well with his walker.  He is asking for food and something to drink and a bus pass.  Was seen here yesterday.  The history is provided by the patient.       Home Medications Prior to Admission medications   Medication Sig Start Date End Date Taking? Authorizing Provider  escitalopram (LEXAPRO) 10 MG tablet Take 1 tablet (10 mg total) by mouth daily. Patient not taking: Reported on 04/05/2022 03/31/22 03/31/23  Rolly Salter, MD  ibuprofen (ADVIL) 200 MG tablet Take 200-400 mg by mouth every 6 (six) hours as needed for mild pain or headache.    [provider]      Allergies    Patient has no known allergies.    Review of Systems   Review of Systems  Physical Exam Updated Vital Signs BP (!) 114/96 (BP Location: Right Arm)   Pulse 94   Temp 98.4 F (36.9 C) (Oral)   Resp 16   SpO2 100%  Physical Exam Vitals and nursing note reviewed.  Constitutional:      General: He is not in acute distress.    Appearance: He is well-developed.  HENT:     Head: Normocephalic and atraumatic.  Eyes:     Conjunctiva/sclera: Conjunctivae normal.  Cardiovascular:     Rate and Rhythm: Normal rate and regular rhythm.     Heart sounds: No murmur heard. Pulmonary:     Effort: Pulmonary effort is normal. No respiratory distress.     Breath sounds: Normal breath  sounds.  Abdominal:     Palpations: Abdomen is soft.     Tenderness: There is no abdominal tenderness.  Musculoskeletal:        General: No tenderness. Normal range of motion.     Cervical back: Neck supple.  Skin:    General: Skin is warm and dry.     Capillary Refill: Capillary refill takes less than 2 seconds.     Findings: No rash.  Neurological:     General: No focal deficit present.     Mental Status: He is alert and oriented to person, place, and time.     Cranial Nerves: No cranial nerve deficit.     Sensory: No sensory deficit.     Motor: No weakness.     Comments: Patient is able to ambulate with his walker without much difficulties  Psychiatric:        Mood and Affect: Mood normal.     ED Results / Procedures / Treatments   Labs (all labs ordered are listed, but only abnormal results are displayed) Labs Reviewed - No data to display  EKG None  Radiology No results found.  Procedures Procedures    Medications Ordered in ED Medications  acetaminophen (TYLENOL) tablet 650 mg (has no administration in  time range)    ED Course/ Medical Decision Making/ A&P                           Medical Decision Making Risk OTC drugs.   Shane Ramirez is here with leg pain.  Normal vitals.  No fever.  History of MS.  Multiple ED visits here over the last several months.  Seems like today as a social visit.  He is homeless.  Was seen here yesterday for the same.  He is asking for food, less past.  Neurologically he seems to be at his baseline.  I have no concern for MS flare.  He did have MRI last month that showed no active demyelination.  I will provide him resources for housing and food.  Patient's given food in the ED and discharge.  Neurovascular neuromuscular intact.  Have no concern for medical emergency at this time.  This chart was dictated using voice recognition software.  Despite best efforts to proofread,  errors can occur which can change the documentation  meaning.         Final Clinical Impression(s) / ED Diagnoses Final diagnoses:  Chronic pain of both feet    Rx / DC Orders ED Discharge Orders     None         Virgina Norfolk, DO 04/22/22 1126

## 2022-04-22 NOTE — Discharge Instructions (Addendum)
Follow-up with neurology 

## 2022-04-22 NOTE — ED Provider Notes (Signed)
[facility]-EMERGENCY DEPT Provider Note   CSN: [id number] Arrival date & time: 04/22/22  1543     History  Chief Complaint  Patient presents with   Numbness    Shane Ramirez is a 36 y.o. male with history of MS, chronic leg leg numbness.  Patient states this is a chronic issue over the last one half 2 years.  He states that nothing has changed with regard to this.  No new falls or injuries.  He is not happy with his outpatient homeless shelter resources.  He was seen earlier today, given additional resources, food as well as a bus pass however he states he never left and decided to check back in.  He uses a walker at baseline.  Has had 34 visits over the last 6 months for similar complaints.  No new numbness, weakness, pain, headache, blurred vision, diplopia, fever, back pain, hx ivdu, bowel or bladder incontinence, saddle paresthesia.  He is not following with neurology for his MS.  Last admission June 15 for MS.  HPI     Home Medications Prior to Admission medications   Medication Sig Start Date End Date Taking? Authorizing Provider  escitalopram (LEXAPRO) 10 MG tablet Take 1 tablet (10 mg total) by mouth daily. Patient not taking: Reported on 04/05/2022 03/31/22 03/31/23  Rolly Salter, MD  ibuprofen (ADVIL) 200 MG tablet Take 200-400 mg by mouth every 6 (six) hours as needed for mild pain or headache.    [provider]      Allergies    Patient has no known allergies.    Review of Systems   Review of Systems  Constitutional: Negative.   HENT: Negative.    Respiratory: Negative.    Cardiovascular: Negative.   Gastrointestinal: Negative.   Genitourinary: Negative.   Musculoskeletal: Negative.   Skin: Negative.   Neurological:  Positive for numbness.  All other systems reviewed and are negative.   Physical Exam Updated Vital Signs BP 131/62 (BP Location: Left Arm)   Pulse 90   Temp 98.8 F (37.1 C) (Oral)   Resp 16   SpO2 97%   Physical Exam Vitals and nursing note reviewed.  Constitutional:      General: He is not in acute distress.    Appearance: He is well-developed. He is not ill-appearing, toxic-appearing or diaphoretic.  HENT:     Head: Normocephalic and atraumatic.  Eyes:     Pupils: Pupils are equal, round, and reactive to light.  Cardiovascular:     Rate and Rhythm: Normal rate and regular rhythm.     Pulses:          Dorsalis pedis pulses are 2+ on the right side and 2+ on the left side.  Pulmonary:     Effort: Pulmonary effort is normal. No respiratory distress.     Breath sounds: Normal breath sounds.  Abdominal:     General: Bowel sounds are normal. There is no distension.     Palpations: Abdomen is soft.  Musculoskeletal:        General: No swelling, tenderness, deformity or signs of injury. Normal range of motion.     Cervical back: Normal range of motion and neck supple.     Right lower leg: No edema.     Left lower leg: No edema.     Comments: Rom without difficulty, compartments soft. No midlein C/T/L tenderness  Skin:    General: Skin is warm and dry.     Capillary Refill:  Capillary refill takes less than 2 seconds.  Neurological:     General: No focal deficit present.     Mental Status: He is alert and oriented to person, place, and time. Mental status is at baseline.     Cranial Nerves: Cranial nerves 2-12 are intact.     Sensory: Sensation is intact.     Motor: Motor function is intact.     Comments: Walks with a walker     ED Results / Procedures / Treatments   Labs (all labs ordered are listed, but only abnormal results are displayed) Labs Reviewed - No data to display  EKG None  Radiology No results found.  Procedures Procedures    Medications Ordered in ED Medications - No data to display  ED Course/ Medical Decision Making/ A&P    36 history of MS, homelessness here for evaluation of chronic numbness and pain in his lower extremities.  Patient states  has been ongoing over the last year and a half to 2 years.  He denies any new numbness or weakness.  States he is unhappy with his outpatient resources for his homelessness.  34 visits over the last 6 months for similar complaints.  He was seen yesterday, multiple times the day before for similar complaints.  Patient is concerned about the rain and his housing.  Was actually seen 3 hours ago discharged home with a bus pass, food however patient states he never left and decided to check back in due to the rain.  Patient's neurologic exam seems to be at his baseline.  I do not feel he is having an active MS exacerbation, VTE, ischemia, occult fracture, dislocation, rhabdomyolysis, infectious process.  I suspect given patient states his pain and numbness has been present for 2 years this is his chronic symptoms from prior MS flare.  DC home with additional social work resources.  The patient has been appropriately medically screened and/or stabilized in the ED. I have low suspicion for any other emergent medical condition which would require further screening, evaluation or treatment in the ED or require inpatient management.  Patient is hemodynamically stable and in no acute distress.  Patient able to ambulate in department prior to ED.  Evaluation does not show acute pathology that would require ongoing or additional emergent interventions while in the emergency department or further inpatient treatment.  I have discussed the diagnosis with the patient and answered all questions.  Pain is been managed while in the emergency department and patient has no further complaints prior to discharge.  Patient is comfortable with plan discussed in room and is stable for discharge at this time.  I have discussed strict return precautions for returning to the emergency department.  Patient was encouraged to follow-up with PCP/specialist refer to at discharge.                           Medical Decision Making Amount  and/or Complexity of Data Reviewed External Data Reviewed: labs, radiology and notes.          Final Clinical Impression(s) / ED Diagnoses Final diagnoses:  Paresthesia    Rx / DC Orders ED Discharge Orders     None         Mariena Meares A, PA-C 04/22/22 1604    Kommor, Wyn Forster, MD 04/23/22 1221

## 2022-04-22 NOTE — ED Triage Notes (Signed)
BIB [facility] from [place] c/o MS flare up. Discharged 2 hours ago.

## 2022-04-22 NOTE — ED Triage Notes (Signed)
Pt presents with c/o leg and foot pain. Pt here multiple times for same over the last few days.

## 2022-04-22 NOTE — ED Triage Notes (Signed)
Pt presents with c/o bilateral leg numbness. Pt seen for the same earlier today and reports he hasn't been able to see neurology since he left this morning.

## 2022-04-23 ENCOUNTER — Encounter (HOSPITAL_COMMUNITY): Payer: Self-pay

## 2022-04-23 ENCOUNTER — Emergency Department (HOSPITAL_COMMUNITY)
Admission: EM | Admit: 2022-04-23 | Discharge: 2022-04-23 | Disposition: A | Discharge status: Home / Self Care | Payer: Self-pay | Attending: Emergency Medicine | Admitting: Emergency Medicine

## 2022-04-23 ENCOUNTER — Emergency Department (HOSPITAL_BASED_OUTPATIENT_CLINIC_OR_DEPARTMENT_OTHER)
Admission: EM | Admit: 2022-04-23 | Discharge: 2022-04-23 | Disposition: A | Discharge status: Home / Self Care | Payer: Self-pay | Attending: Emergency Medicine | Admitting: Emergency Medicine

## 2022-04-23 ENCOUNTER — Encounter (HOSPITAL_BASED_OUTPATIENT_CLINIC_OR_DEPARTMENT_OTHER): Payer: Self-pay | Admitting: Obstetrics and Gynecology

## 2022-04-23 DIAGNOSIS — R202 Paresthesia of skin: Secondary | ICD-10-CM | POA: Insufficient documentation

## 2022-04-23 DIAGNOSIS — R531 Weakness: Secondary | ICD-10-CM | POA: Insufficient documentation

## 2022-04-23 DIAGNOSIS — R200 Anesthesia of skin: Secondary | ICD-10-CM | POA: Insufficient documentation

## 2022-04-23 NOTE — ED Provider Notes (Signed)
  [facility] EMERGENCY DEPT Provider Note   CSN: [id number] Arrival date & time: 04/23/22  1512     History  Chief Complaint  Patient presents with   Weakness    Shane Ramirez is a 36 y.o. male.  Patient presents chief complaint of feeling numbness in bilateral legs.  Denies any pain or fall or other trauma.  Denies fevers cough vomiting or diarrhea.       Home Medications Prior to Admission medications   Medication Sig Start Date End Date Taking? Authorizing Provider  escitalopram (LEXAPRO) 10 MG tablet Take 1 tablet (10 mg total) by mouth daily. Patient not taking: Reported on 04/05/2022 03/31/22 03/31/23  Rolly Salter, MD  ibuprofen (ADVIL) 200 MG tablet Take 200-400 mg by mouth every 6 (six) hours as needed for mild pain or headache.    [provider]      Allergies    Patient has no known allergies.    Review of Systems   Review of Systems  Constitutional:  Negative for fever.  HENT:  Negative for ear pain and sore throat.   Eyes:  Negative for pain.  Respiratory:  Negative for cough.   Cardiovascular:  Negative for chest pain.  Gastrointestinal:  Negative for abdominal pain.  Genitourinary:  Negative for flank pain.  Musculoskeletal:  Negative for back pain.  Skin:  Negative for color change and rash.  Neurological:  Negative for syncope.  All other systems reviewed and are negative.   Physical Exam Updated Vital Signs BP 107/66   Pulse 74   Temp 98 F (36.7 C)   Resp 19   Ht 6\' 3"  (1.905 m)   Wt 103.9 kg   SpO2 100%   BMI 28.63 kg/m  Physical Exam Constitutional:      Appearance: He is well-developed.  HENT:     Head: Normocephalic.     Nose: Nose normal.  Eyes:     Extraocular Movements: Extraocular movements intact.  Cardiovascular:     Rate and Rhythm: Normal rate.  Pulmonary:     Effort: Pulmonary effort is normal.  Musculoskeletal:     Comments: 5/5 strength all extremities.  Gait is normal without  assistance.  No pain or tenderness on exam of bilateral lower extremities compartments are soft neurovascularly intact bilateral lower extremities.  Sensation appears intact to light touch.  Skin:    Coloration: Skin is not jaundiced.  Neurological:     Mental Status: He is alert. Mental status is at baseline.     ED Results / Procedures / Treatments   Labs (all labs ordered are listed, but only abnormal results are displayed) Labs Reviewed - No data to display  EKG None  Radiology No results found.  Procedures Procedures    Medications Ordered in ED Medications - No data to display  ED Course/ Medical Decision Making/ A&P                           Medical Decision Making  Cardiac monitoring showing sinus rhythm.  Review of record shows multiple visits for similar complaints.  Patient discharged home in stable condition, advised continued outpatient follow-up as needed.        Final Clinical Impression(s) / ED Diagnoses Final diagnoses:  Paresthesia    Rx / DC Orders ED Discharge Orders     None         , MD 04/23/22 1718

## 2022-04-23 NOTE — ED Notes (Signed)
RN provided AVS using Teachback Method. Patient verbalizes understanding of Discharge Instructions. Opportunity for Questioning and Answers were provided by RN. Patient Discharged from ED in Wheelchair to Department Exit.

## 2022-04-23 NOTE — ED Notes (Signed)
Pt to bathroom via wheelchair, ambulated from bathroom doorway to toilet without walker without issue. Walked back to doorway to wheelchair, back to room.   Pt sat in chair in room, yelling "I fucking hate this place" Pt will not explain further why he feels this way.

## 2022-04-23 NOTE — ED Notes (Signed)
Pt ambulated to the bathroom with walker.

## 2022-04-23 NOTE — ED Triage Notes (Signed)
Pateint reports to the ER for bilateral leg numbness. Patient seen multiple times for same. Seen at [facility] x2 today

## 2022-04-23 NOTE — Discharge Instructions (Addendum)
Call your primary care doctor or specialist as discussed in the next 2-3 days.   Return immediately back to the ER if:  Your symptoms worsen within the next 12-24 hours. You develop new symptoms such as new fevers, persistent vomiting, new pain, shortness of breath, or new weakness or numbness, or if you have any other concerns.  

## 2022-04-23 NOTE — ED Notes (Signed)
Reviewed discharge instructions with pt. Pt was discharged to Department Exit.

## 2022-04-23 NOTE — ED Provider Notes (Signed)
[facility] EMERGENCY DEPT Provider Note   CSN: [id number] Arrival date & time: 04/23/22  2011     History  Chief Complaint  Patient presents with   Weakness    Shane Ramirez is a 36 y.o. male.  Patient returns back to the ER.  He states he has no complaints but was "made to come back."  Complaining of persistent numbness to bilateral legs unchanged for over a year he states.  Denies any recent falls or trauma.  No fever no cough no vomiting or diarrhea denies any pain.  I asked him if he has any additional concerns he says that he has no new complaints.       Home Medications Prior to Admission medications   Medication Sig Start Date End Date Taking? Authorizing Provider  escitalopram (LEXAPRO) 10 MG tablet Take 1 tablet (10 mg total) by mouth daily. Patient not taking: Reported on 04/05/2022 03/31/22 03/31/23  Rolly Salter, MD  ibuprofen (ADVIL) 200 MG tablet Take 200-400 mg by mouth every 6 (six) hours as needed for mild pain or headache.    [provider]      Allergies    Patient has no known allergies.    Review of Systems   Review of Systems  Constitutional:  Negative for fever.  HENT:  Negative for ear pain and sore throat.   Eyes:  Negative for pain.  Respiratory:  Negative for cough.   Cardiovascular:  Negative for chest pain.  Gastrointestinal:  Negative for abdominal pain.  Genitourinary:  Negative for flank pain.  Musculoskeletal:  Negative for back pain.  Skin:  Negative for color change and rash.  Neurological:  Negative for syncope.  All other systems reviewed and are negative.   Physical Exam Updated Vital Signs BP 111/69 (BP Location: Left Arm)   Pulse 96   Temp 97.8 F (36.6 C) (Temporal)   Resp 20   SpO2 100%  Physical Exam Constitutional:      Appearance: He is well-developed.  HENT:     Head: Normocephalic.     Nose: Nose normal.  Eyes:     Extraocular Movements: Extraocular movements intact.   Cardiovascular:     Rate and Rhythm: Normal rate.  Pulmonary:     Effort: Pulmonary effort is normal.  Skin:    Coloration: Skin is not jaundiced.  Neurological:     Mental Status: He is alert. Mental status is at baseline.     Cranial Nerves: No cranial nerve deficit.     Motor: No weakness.     Comments: 5/5 strength all extremities.     ED Results / Procedures / Treatments   Labs (all labs ordered are listed, but only abnormal results are displayed) Labs Reviewed - No data to display  EKG None  Radiology No results found.  Procedures Procedures    Medications Ordered in ED Medications - No data to display  ED Course/ Medical Decision Making/ A&P                           Medical Decision Making  No focal neurodeficit on exam.  Patient discharged home.  Advise follow-up with his primary care doctor this week.        Final Clinical Impression(s) / ED Diagnoses Final diagnoses:  Paresthesia    Rx / DC Orders ED Discharge Orders     None         China, Eustace Moore,  MD 04/23/22 2018

## 2022-04-23 NOTE — ED Provider Notes (Signed)
[facility]-EMERGENCY DEPT Provider Note   CSN: [id number] Arrival date & time: 04/22/22  2039     History  Chief Complaint  Patient presents with   Leg Pain    Shane Ramirez is a 36 y.o. male who presents via [facility] with concerns for MS flareup.  Patient has had 35 visits to the ED in the past 6 months.  Patient was evaluated less than 12 hours ago in the ED for similar concerns.  Patient denies any new complaints at this time.  Patient has a walker that he ambulates with.  The history is provided by the patient. No language interpreter was used.       Home Medications Prior to Admission medications   Medication Sig Start Date End Date Taking? Authorizing Provider  escitalopram (LEXAPRO) 10 MG tablet Take 1 tablet (10 mg total) by mouth daily. Patient not taking: Reported on 04/05/2022 03/31/22 03/31/23  Rolly Salter, MD  ibuprofen (ADVIL) 200 MG tablet Take 200-400 mg by mouth every 6 (six) hours as needed for mild pain or headache.    [provider]      Allergies    Patient has no known allergies.    Review of Systems   Review of Systems  Neurological:  Positive for numbness (chronic, 1.5 years).  All other systems reviewed and are negative.   Physical Exam Updated Vital Signs BP (!) 149/87 (BP Location: Left Arm)   Pulse 72   Temp 98.1 F (36.7 C) (Oral)   Resp 18   Ht 6\' 3"  (1.905 m)   Wt 103.9 kg   SpO2 100%   BMI 28.62 kg/m  Physical Exam Vitals and nursing note reviewed.  Constitutional:      General: He is not in acute distress.    Appearance: He is not diaphoretic.  HENT:     Head: Normocephalic and atraumatic.     Mouth/Throat:     Pharynx: No oropharyngeal exudate.  Eyes:     General: No scleral icterus.    Conjunctiva/sclera: Conjunctivae normal.  Cardiovascular:     Rate and Rhythm: Normal rate and regular rhythm.     Pulses: Normal pulses.     Heart sounds: Normal heart sounds.  Pulmonary:     Effort:  Pulmonary effort is normal. No respiratory distress.     Breath sounds: Normal breath sounds. No wheezing.  Abdominal:     General: Bowel sounds are normal.     Palpations: Abdomen is soft.  Musculoskeletal:        General: Normal range of motion.     Cervical back: Normal range of motion and neck supple.     Comments: No TTP noted to BLE.  No overlying skin changes.  Pedal pulses intact bilaterally.  Able to range all extremities without difficulty  Skin:    General: Skin is warm and dry.  Neurological:     Mental Status: He is alert.  Psychiatric:        Behavior: Behavior normal.     ED Results / Procedures / Treatments   Labs (all labs ordered are listed, but only abnormal results are displayed) Labs Reviewed - No data to display  EKG None  Radiology No results found.  Procedures Procedures    Medications Ordered in ED Medications - No data to display  ED Course/ Medical Decision Making/ A&P  Medical Decision Making  Pt presents with chronic lower extremity numbness onset 1.5 years. . Vital signs, pt afebrile. On exam, pt with no tenderness to palpation noted to bilateral lower extremities.  Pedal pulses intact bilaterally.  Able to range lower extremities without difficulty.  No acute cardiovascular, respiratory, abdominal exam findings.   Disposition: Patient presenting for evaluation of chronic numbness x 1.5 years. Has been evaluated today. No new symptoms. This is has been the patient's 35th visit in the past 6 months, no new findings and numbness has been ongoing for the past 1.5 years, feel the patient can be safely discharged at this time with neurology follow-up.  After consideration of the diagnostic results and the patients response to treatment, I feel that the patient would benefit from Discharge home.  Patient offered graham crackers and saltines as well as something to drink prior to discharge.  Discussed with patient importance  of follow-up with neurologist.  Patient also provided with information for [facility] health community health and wellness.  Supportive care measures and strict return precautions discussed with patient at bedside. Pt acknowledges and verbalizes understanding. Pt appears safe for discharge. Follow up as indicated in discharge paperwork.    This chart was dictated using voice recognition software, Dragon. Despite the best efforts of this provider to proofread and correct errors, errors may still occur which can change documentation meaning.  Final Clinical Impression(s) / ED Diagnoses Final diagnoses:  Paresthesia    Rx / DC Orders ED Discharge Orders     None         Janielle Mittelstadt A, PA-C 04/23/22 9211    Maia Plan, MD 04/23/22 [phone]

## 2022-04-23 NOTE — Discharge Instructions (Signed)
It was a pleasure taking care of you today!   Attached is information for the neurologist, call them and set up a follow up appointment regarding todays ED visit. You may follow-up with colonoscopy to the health and wellness for primary care needs.  Return to the ED if you are experiencing increasing/worsening numbness, inability to walk, or worsening symptoms.

## 2022-04-23 NOTE — ED Triage Notes (Signed)
Pt arrived via EMS, c/o bilateral leg numbness. Seen several times for same. Discharged 6am this morning.

## 2022-04-23 NOTE — ED Provider Notes (Signed)
[facility]-EMERGENCY DEPT Provider Note   CSN: [id number] Arrival date & time: 04/23/22  3267     History  No chief complaint on file.   Shane Ramirez is a 36 y.o. male.  36 year old male with prior medical history as detailed below is registered in the ED for evaluation.  Patient is without acute complaint.  Patient apparently has not left the ED waiting room since his last evaluation.  He was discharged at 6 AM today (it is now 8:45).  ED staff felt the patient could benefit from a possible social work re-evaluation.  Patient is requesting something to eat.  Patient is agreeable to talking with social worker again.  Patient is without acute medical complaint.  Patient with history of untreated MS.  Patient with history of homelessness.  The history is provided by the patient and medical records.       Home Medications Prior to Admission medications   Medication Sig Start Date End Date Taking? Authorizing Provider  escitalopram (LEXAPRO) 10 MG tablet Take 1 tablet (10 mg total) by mouth daily. Patient not taking: Reported on 04/05/2022 03/31/22 03/31/23  Rolly Salter, MD  ibuprofen (ADVIL) 200 MG tablet Take 200-400 mg by mouth every 6 (six) hours as needed for mild pain or headache.    [provider]      Allergies    Patient has no known allergies.    Review of Systems   Review of Systems  All other systems reviewed and are negative.   Physical Exam Updated Vital Signs BP 123/73   Pulse 72   Temp 98.3 F (36.8 C) (Oral)   Resp 16   SpO2 100%  Physical Exam Vitals and nursing note reviewed.  Constitutional:      General: He is not in acute distress.    Appearance: Normal appearance. He is well-developed.  HENT:     Head: Normocephalic and atraumatic.  Eyes:     Conjunctiva/sclera: Conjunctivae normal.     Pupils: Pupils are equal, round, and reactive to light.  Cardiovascular:     Rate and Rhythm: Normal rate and  regular rhythm.     Heart sounds: Normal heart sounds.  Pulmonary:     Effort: Pulmonary effort is normal. No respiratory distress.     Breath sounds: Normal breath sounds.  Abdominal:     General: There is no distension.     Palpations: Abdomen is soft.     Tenderness: There is no abdominal tenderness.  Musculoskeletal:        General: No deformity. Normal range of motion.     Cervical back: Normal range of motion and neck supple.     Comments: No TTP to BLE.   Ambulatory with walker assistance. This is his baseline.   Skin:    General: Skin is warm and dry.  Neurological:     General: No focal deficit present.     Mental Status: He is alert and oriented to person, place, and time.     ED Results / Procedures / Treatments   Labs (all labs ordered are listed, but only abnormal results are displayed) Labs Reviewed - No data to display  EKG None  Radiology No results found.  Procedures Procedures    Medications Ordered in ED Medications - No data to display  ED Course/ Medical Decision Making/ A&P  Medical Decision Making   Medical Screen Complete  This patient presented to the ED with complaint of homeless, MS.  This complaint involves an extensive number of treatment options. The initial differential diagnosis includes, but is not limited to, homelessness/  This presentation is: Chronic, Self-Limited, Previously Undiagnosed, and Uncertain Prognosis  Patient with chronic homelessness and chronic MS.  Patient without acute medical complaint.  No indication for ED re-evaluation or work-up.  Social work has seen this patient multiple times.  They are without additional options or referrals for this patient.  Co morbidities that complicated the patient's evaluation  Hx of MS   Additional history obtained:  External records from outside sources obtained and reviewed including prior ED visits and prior Inpatient records.    Problem List / ED Course:  Homeless   Reevaluation:  After the interventions noted above, I reevaluated the patient and found that they have: stayed the same   Social Determinants of Health:  Homeless   Disposition:  After consideration of the diagnostic results and the patients response to treatment, I feel that the patent would benefit from close outpatient followup.          Final Clinical Impression(s) / ED Diagnoses Final diagnoses:  Paresthesia    Rx / DC Orders ED Discharge Orders     None         Wynetta Fines, MD 04/23/22 [phone]

## 2022-04-23 NOTE — ED Triage Notes (Signed)
Pt via wheelchair by EMS from outside. Sts wanting re-evaluation of bilateral leg weakness. Endorsed no change since discharged earlier today from this facility.

## 2022-04-25 ENCOUNTER — Emergency Department (HOSPITAL_COMMUNITY)
Admission: EM | Admit: 2022-04-25 | Discharge: 2022-04-25 | Disposition: A | Discharge status: Home / Self Care | Payer: Self-pay | Attending: Emergency Medicine | Admitting: Emergency Medicine

## 2022-04-25 ENCOUNTER — Other Ambulatory Visit: Payer: Self-pay

## 2022-04-25 ENCOUNTER — Encounter (HOSPITAL_COMMUNITY): Payer: Self-pay

## 2022-04-25 DIAGNOSIS — R5381 Other malaise: Secondary | ICD-10-CM

## 2022-04-25 DIAGNOSIS — Z5900 Homelessness unspecified: Secondary | ICD-10-CM

## 2022-04-25 NOTE — Discharge Instructions (Signed)
Follow-up with a medical doctor for regular medical care in 1 or 2 weeks.  Use the resource guide to help you find a doctor

## 2022-04-25 NOTE — Progress Notes (Signed)
Transition of Care [facility]) - Emergency Department Mini Assessment   Patient Details  Name: Shane Ramirez MRN: [id number] Date of Birth: 04-06-1986  Transition of Care [facility]) CM/SW Contact:    Terah Robey C Tarpley-Carter, LCSWA Phone Number: 04/25/2022, 7:07 PM   Clinical Narrative: [facility] CSW consulted with pt.  Pt stated he is unhoused and in need of resources.  CSW provided pt with resources and explained the shelters process.  Pt stated he would call shelters and do the assessment.  Kerwin Augustus Tarpley-Carter, MSW, LCSW-A Pronouns:  She/Her/Hers [facility] Clinical Social Worker Direct Number:  [phone] [email or web address]  ED Mini Assessment: What brought you to the Emergency Department? : Medical Clearance  Barriers to Discharge: No Barriers Identified     Means of departure: Not know  Interventions which prevented an admission or readmission: Homeless Screening    Patient Contact and Communications        ,          Patient states their goals for this hospitalization and ongoing recovery are:: Needs homeless resources   Choice offered to / list presented to : NA  Admission diagnosis:  MS pain Patient Active Problem List   Diagnosis Date Noted   Diarrhea 03/29/2022   Multiple sclerosis exacerbation (HCC) 03/29/2022   Recurrent falls 03/24/2022   Multiple sclerosis (HCC) 03/24/2022   Homeless 03/24/2022   PCP:  Oneita Hurt, No Pharmacy:   [facility] [address] [place] [address] Phone: [phone] Fax: [phone]

## 2022-04-25 NOTE — ED Provider Notes (Signed)
[facility] EMERGENCY DEPARTMENT Provider Note   CSN: [id number] Arrival date & time: 04/25/22  1101     History  Chief Complaint  Patient presents with   Medical Clearance    Shane Ramirez is a 36 y.o. male.  HPI Patient presents for evaluation of difficulty feeling his legs.  He is homeless.  He has numerous ED visits.  Most recently comprehensively evaluated, 2 days ago.    Home Medications Prior to Admission medications   Medication Sig Start Date End Date Taking? Authorizing Provider  escitalopram (LEXAPRO) 10 MG tablet Take 1 tablet (10 mg total) by mouth daily. Patient not taking: Reported on 04/05/2022 03/31/22 03/31/23  Rolly Salter, MD  ibuprofen (ADVIL) 200 MG tablet Take 200-400 mg by mouth every 6 (six) hours as needed for mild pain or headache.    [provider]      Allergies    Patient has no known allergies.    Review of Systems   Review of Systems  Physical Exam Updated Vital Signs BP 108/63   Pulse 86   Temp 98.6 F (37 C) (Oral)   Resp 16   SpO2 94%  Physical Exam Vitals and nursing note reviewed.  Constitutional:      General: He is not in acute distress.    Appearance: Normal appearance. He is well-developed. He is not ill-appearing.  HENT:     Head: Normocephalic and atraumatic.     Right Ear: External ear normal.     Left Ear: External ear normal.  Eyes:     Conjunctiva/sclera: Conjunctivae normal.     Pupils: Pupils are equal, round, and reactive to light.  Neck:     Trachea: Phonation normal.  Cardiovascular:     Rate and Rhythm: Normal rate.  Pulmonary:     Effort: Pulmonary effort is normal.  Abdominal:     General: There is no distension.  Musculoskeletal:        General: Normal range of motion.     Cervical back: Normal range of motion and neck supple.  Skin:    General: Skin is warm and dry.  Neurological:     Mental Status: He is alert.     Cranial Nerves: No cranial nerve deficit.      Motor: No abnormal muscle tone.     Coordination: Coordination normal.  Psychiatric:        Mood and Affect: Mood normal.        Behavior: Behavior normal.     ED Results / Procedures / Treatments   Labs (all labs ordered are listed, but only abnormal results are displayed) Labs Reviewed - No data to display  EKG None  Radiology No results found.  Procedures Procedures    Medications Ordered in ED Medications - No data to display  ED Course/ Medical Decision Making/ A&P                           Medical Decision Making Patient here for assistance with homelessness.  He was seen by [facility] case manager who arranged to get him some help.  Problems Addressed: Homelessness: chronic illness or injury  Risk Decision regarding hospitalization. Diagnosis or treatment significantly limited by social determinants of health. Risk Details: Nonspecific symptoms, previously comprehensively evaluated.  He does not require ED evaluation.  He was connected with social services in the ED, who assisted him.  He is discharged.  Final Clinical Impression(s) / ED Diagnoses Final diagnoses:  Homelessness  Malaise    Rx / DC Orders ED Discharge Orders     None         Mancel Bale, MD 04/25/22 1742

## 2022-04-25 NOTE — ED Triage Notes (Signed)
BIB EMS for his MS pain. Patient has been seen numerous times and even banned from property.

## 2022-04-25 NOTE — ED Provider Triage Note (Signed)
Emergency Medicine Provider Triage Evaluation Note  Shane Ramirez , a 36 y.o. male  was evaluated in triage.  Pt complains of "cannot feel his legs for a year."  Seen all the time for this  Review of Systems  Positive: Cannot feel his legs Negative:   Physical Exam  BP 108/63   Pulse 86   Temp 98.6 F (37 C) (Oral)   Resp 16   SpO2 94%  Gen:   Awake, no distress   Resp:  Normal effort  MSK:   Moves extremities without difficulty  Other:    Medical Decision Making  Medically screening exam initiated at 11:18 AM.  Appropriate orders placed.  Pinchas Reither was informed that the remainder of the evaluation will be completed by another provider, this initial triage assessment does not replace that evaluation, and the importance of remaining in the ED until their evaluation is complete.     Saddie Benders, PA-C 04/25/22 1119

## 2022-04-26 ENCOUNTER — Other Ambulatory Visit: Payer: Self-pay

## 2022-04-26 ENCOUNTER — Encounter (HOSPITAL_COMMUNITY): Payer: Self-pay | Admitting: Emergency Medicine

## 2022-04-26 ENCOUNTER — Emergency Department (HOSPITAL_COMMUNITY)
Admission: EM | Admit: 2022-04-26 | Discharge: 2022-04-26 | Disposition: A | Discharge status: Home / Self Care | Payer: Self-pay | Attending: Emergency Medicine | Admitting: Emergency Medicine

## 2022-04-26 DIAGNOSIS — R202 Paresthesia of skin: Secondary | ICD-10-CM | POA: Insufficient documentation

## 2022-04-26 NOTE — Discharge Instructions (Signed)
Please contact primary care provider for further workup of your numbness.

## 2022-04-26 NOTE — ED Provider Triage Note (Signed)
Emergency Medicine Provider Triage Evaluation Note  Shane Ramirez , a 37 y.o. male  was evaluated in triage.  Pt complains of numbness for 1-1/2 years.  History of MS.  Patient seen all the time for this in the ED.  States she slept outside.  Requesting bus pass.  Does not know where the bus pass would be to..  Review of Systems  Positive: Numbness x 1.5 years Negative: Fevers, new symptoms  Physical Exam  BP 122/61 (BP Location: Right Arm)   Pulse 73   Temp 97.9 F (36.6 C)   Resp 18   SpO2 100%  Gen:   Awake, no distress   Resp:  Normal effort  MSK:   Moves extremities without difficulty  Other:    Medical Decision Making  Medically screening exam initiated at 6:06 AM.  Appropriate orders placed.  Shane Ramirez was informed that the remainder of the evaluation will be completed by another provider, this initial triage assessment does not replace that evaluation, and the importance of remaining in the ED until their evaluation is complete.     Theron Arista, [place] 04/26/22 [phone]

## 2022-04-26 NOTE — ED Triage Notes (Signed)
Pt reported to ED for a bus pass.

## 2022-04-26 NOTE — ED Provider Notes (Signed)
[facility] EMERGENCY DEPARTMENT Provider Note   CSN: [id number] Arrival date & time: 04/26/22  0559     History PMH: MS,homeless Chief Complaint  Patient presents with   Homeless    Shane Ramirez is a 36 y.o. male.  Patient presents to the emergency department for social concerns as well as numbness in both of his feet.  I asked him about the numbness in his feet and he says this has been going on for 2-1/2 years.  He has been worked up numerous times for it.  He says there is absolutely no change in it.  Denies any other new complaints.  HPI     Home Medications Prior to Admission medications   Medication Sig Start Date End Date Taking? Authorizing Provider  escitalopram (LEXAPRO) 10 MG tablet Take 1 tablet (10 mg total) by mouth daily. Patient not taking: Reported on 04/05/2022 03/31/22 03/31/23  Rolly Salter, MD  ibuprofen (ADVIL) 200 MG tablet Take 200-400 mg by mouth every 6 (six) hours as needed for mild pain or headache.    [provider]      Allergies    Patient has no known allergies.    Review of Systems   Review of Systems  Neurological:  Positive for numbness.  All other systems reviewed and are negative.   Physical Exam Updated Vital Signs BP 118/82 (BP Location: Right Arm)   Pulse 79   Temp 98.9 F (37.2 C) (Oral)   Resp 15   SpO2 100%  Physical Exam Vitals and nursing note reviewed.  Constitutional:      General: He is not in acute distress.    Appearance: Normal appearance. He is well-developed. He is not ill-appearing, toxic-appearing or diaphoretic.  HENT:     Head: Normocephalic and atraumatic.     Nose: No nasal deformity.     Mouth/Throat:     Lips: Pink. No lesions.  Eyes:     General: Gaze aligned appropriately. No scleral icterus.       Right eye: No discharge.        Left eye: No discharge.     Conjunctiva/sclera: Conjunctivae normal.     Right eye: Right conjunctiva is not injected. No exudate or  hemorrhage.    Left eye: Left conjunctiva is not injected. No exudate or hemorrhage. Pulmonary:     Effort: Pulmonary effort is normal. No respiratory distress.  Skin:    General: Skin is warm and dry.  Neurological:     Mental Status: He is alert and oriented to person, place, and time.     Comments: 5 out of 5 motor strength in all extremities.  Sensation equal in all extremities.  Gait normal.  Psychiatric:        Mood and Affect: Mood normal.        Speech: Speech normal.        Behavior: Behavior normal. Behavior is cooperative.     ED Results / Procedures / Treatments   Labs (all labs ordered are listed, but only abnormal results are displayed) Labs Reviewed - No data to display  EKG None  Radiology No results found.  Procedures Procedures   Medications Ordered in ED Medications - No data to display  ED Course/ Medical Decision Making/ A&P                           Medical Decision Making  Patient presents with a chronic  problem of paresthesias in bilateral lower feet.  This is unchanged from baseline.  He has a normal neurological exam.  Do not recommend any further testing at this time as he has been seen numerous times for the same issue.  Recommend discharge.  Final Clinical Impression(s) / ED Diagnoses Final diagnoses:  Paresthesias    Rx / DC Orders ED Discharge Orders     None         Claudie Leach, PA-C 04/26/22 1231    Terald Sleeper, MD 04/26/22 1301

## 2022-04-26 NOTE — ED Notes (Signed)
All discharge instructions reviewed with patient and patient verbalized understanding of same. Patient stable at time of discharge.  ?
